# Patient Record
Sex: Female | Born: 1980 | Race: Black or African American | Hispanic: No | Marital: Single | Smoking: Never smoker
Health system: Southern US, Community
[De-identification: ages and names within clinical notes are randomized; demographics above are authoritative.]

## PROBLEM LIST (undated history)

## (undated) DIAGNOSIS — Z789 Other specified health status: Secondary | ICD-10-CM

## (undated) HISTORY — PX: NO PAST SURGERIES: SHX2092

## (undated) HISTORY — PX: BREAST ENHANCEMENT SURGERY: SHX7

---

## 2002-11-29 ENCOUNTER — Other Ambulatory Visit: Admission: RE | Admit: 2002-11-29 | Discharge: 2002-11-29 | Payer: Self-pay | Admitting: Obstetrics and Gynecology

## 2002-12-11 ENCOUNTER — Encounter: Payer: Self-pay | Admitting: Obstetrics and Gynecology

## 2002-12-11 ENCOUNTER — Ambulatory Visit (HOSPITAL_COMMUNITY): Admission: RE | Admit: 2002-12-11 | Discharge: 2002-12-11 | Payer: Self-pay | Admitting: Obstetrics and Gynecology

## 2003-05-16 ENCOUNTER — Inpatient Hospital Stay (HOSPITAL_COMMUNITY): Admission: AD | Admit: 2003-05-16 | Discharge: 2003-05-17 | Payer: Self-pay | Admitting: Obstetrics and Gynecology

## 2003-05-19 ENCOUNTER — Encounter (INDEPENDENT_AMBULATORY_CARE_PROVIDER_SITE_OTHER): Payer: Self-pay | Admitting: Specialist

## 2003-05-19 ENCOUNTER — Inpatient Hospital Stay (HOSPITAL_COMMUNITY): Admission: AD | Admit: 2003-05-19 | Discharge: 2003-05-23 | Payer: Self-pay | Admitting: Obstetrics and Gynecology

## 2003-07-12 ENCOUNTER — Emergency Department (HOSPITAL_COMMUNITY): Admission: EM | Admit: 2003-07-12 | Discharge: 2003-07-12 | Payer: Self-pay | Admitting: Emergency Medicine

## 2006-11-09 ENCOUNTER — Inpatient Hospital Stay (HOSPITAL_COMMUNITY): Admission: AD | Admit: 2006-11-09 | Discharge: 2006-11-11 | Payer: Self-pay | Admitting: Obstetrics and Gynecology

## 2007-08-30 ENCOUNTER — Encounter: Admission: RE | Admit: 2007-08-30 | Discharge: 2007-08-30 | Payer: Self-pay | Admitting: Internal Medicine

## 2007-09-14 ENCOUNTER — Emergency Department (HOSPITAL_COMMUNITY): Admission: EM | Admit: 2007-09-14 | Discharge: 2007-09-14 | Payer: Self-pay | Admitting: Emergency Medicine

## 2010-09-02 NOTE — Discharge Summary (Signed)
Maria Hoffman, Maria Hoffman           ACCOUNT NO.:  [id number]   MEDICAL RECORD NO.:  [id number]          PATIENT TYPE:  INP   LOCATION:  9136                          FACILITY:  [facility]   PHYSICIAN:  Sherron Monday, MD        DATE OF BIRTH:  25-Nov-1980   DATE OF ADMISSION:  11/09/2006  DATE OF DISCHARGE:  11/11/2006                               DISCHARGE SUMMARY   ADMITTING DIAGNOSIS:  Intrauterine pregnancy at term with favorable  cervix for induction.   DISCHARGE DIAGNOSIS:  Intrauterine pregnancy at term with favorable  cervix for induction, delivered by spontaneous vaginal delivery.   HISTORY OF PRESENT ILLNESS:  A 30 year old African American female G2,  P1-0-0-1 at 40+ weeks by 16-week ultrasound with an [facility] of November 03, 2006, presents for induction of unfavorable cervix.  Her prenatal care  was essentially uncomplicated.  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  See prenatal forms for complete  history.   PAST MEDICAL HISTORY:  Significant for history of postpartum thyroid  dysfunction.   PAST SURGICAL HISTORY:  Not significant.   PAST OB/GYN HISTORY:  G1 was in 2005, spontaneous vaginal delivery 7  pounds 6 ounces, no complications.  She has a history of herpes  infection as well.   MEDICATIONS:  Valtrex daily.   ALLERGIES:  No known drug allergies.   SOCIAL HISTORY:  Patient denies alcohol, tobacco or drug use.   PRENATAL LABS:  She is O positive, antibody screen negative, RPR  nonreactive, rubella immune, hepatitis C surface antigen negative,  HIV  negative, gonorrhea and Chlamydia negative.  Quad screen within normal  limits, one-hour Glucola of 78, group B strep was negative.  On  admission, she was afebrile.  Vital signs stable.  Fetal heart tones  reactive with contractions every 3-4 minutes.  Abdomen was gravida,  nontender with an estimated fetal weight of approximately 8 pounds.  Her  vaginal rim was 316 -1.  Her vertex  was an adequate pelvis.  At the time  of her admission, she is AROM for clear fluid.  Her examination was  otherwise benign.  She was admitted, started on Pitocin and her progress  was monitored.  Her intrapartum course was relatively uncomplicated.  She progressed to complete +2, pushed well to deliver a viable female  infant at 2128 hours and with a weight of 6 pounds 15 ounces.  Placenta  was delivered intact and sent the cord, blood collection.  Her perineum  was intact.  Her postpartum course was relatively uncomplicated.  She  maintained stable vital signs throughout and benign examination.  She  desired to go home on postpartum day 2.  This was discussed with the  patient including routine discharge instructions.  If any worsening to  call if any questions or problems.  She voices her understanding to all  this and wishes to be discharged.  Her hemoglobin decreased from 11.5 to 9.9.  Other discharge information:  She is O positive, rubella immune.  She plans to breastfeed and would  like to get an IUD postpartum.  We will chest her benefits and mail her  a packet regarding this.      Sherron Monday, MD  Electronically Signed     JB/MEDQ  D:  11/11/2006  T:  11/11/2006  Job:  [id number]

## 2010-09-05 NOTE — Discharge Summary (Signed)
NAME:  Maria Hoffman, Maria Hoffman                     ACCOUNT NO.:  [id number]   MEDICAL RECORD NO.:  [id number]                   PATIENT TYPE:  INP   LOCATION:  9122                                 FACILITY:  [facility]   PHYSICIAN:  Malachi Pro. Ambrose Mantle, M.D.              DATE OF BIRTH:  1981/02/15   DATE OF ADMISSION:  05/19/2003  DATE OF DISCHARGE:  05/23/2003                                 DISCHARGE SUMMARY   A 30 year old single black female para 0, gravida 1, EDC May 14, 2003,  by ultrasound admitted with contractions, low grade temperature elevation  and possible prolonged rupture of membranes.  Blood group and type O  positive, negative antibody, sickle cell negative.  RPR nonreactive.  Rubella immune.  Hepatitis B surface antigen negative.  HIV negative.  GC  and chlamydia negative.  Triple screen normal.  Group B strep positive.  One  hour Glucola 98.  Vaginal ultrasound October 10, 2002, crown rump length 2.48  cm, 9 weeks one day, [facility] May 14, 2003.  Repeat ultrasound on December 11, 2002, mean gestational age [redacted] weeks one day, [facility] May 13, 2003.  Echogenic intracardiac focus was noted in the left ventricle.   Prenatal care was uncomplicated.  The patient was placed on Valtrex 500 mg  by mouth everyday on April 11, 2003, because of a history of Herpes.  Cervix was fingertip dilated, 50% effaced, vertex at a -3 on May 14, 2003, and she was scheduled for induction on May 24, 2003.  The patient  had possible premature rupture of membranes on May 17, 2003, and was  seen in the maternity admission unit.  Crist Fat was negative.  The patient  states she began to have symptoms of a URI and felt feverish.  Now she has  congestion in her chest and she has continued to leak fluid.  She came to  the maternity admission unit on the day of admission with a temperature of  100.0 degrees, contractions every two to four minutes.  She was admitted  after labor did not ensue and she was  placed on Pitocin.   PAST MEDICAL HISTORY:  No known allergies, no operations, no illnesses.   ALCOHOL, TOBACCO AND DRUGS:  None.   FAMILY HISTORY:  Mother with thyroid problem.  No close family history of  any major illnesses.   PHYSICAL EXAMINATION:  VITAL SIGNS:  Temperature was 100 degrees then became  99.9, pulse 130, respiratory rate 20, blood pressure 121/87.  LUNGS:  Clear.  CARDIOVASCULAR:  There was a normal sinus tachycardia with no murmurs.  ABDOMEN:  Soft and nontender.  Fetal heart tones were normal.  PELVIC:  The cervix was loose, fingertip, 80% effaced, vertex at a -2.   Artificial rupture of membranes was attempted but there was no fluid.  I was  not certain any membranes were present.   IMPRESSION:  1. Intrauterine pregnancy at 40+ weeks.  2.  Upper respiratory tract infection.  3. Possible prolonged rupture of membranes.   The patient was initially placed on penicillin but I elected to broaden her  coverage with Unasyn because of the possible prolonged rupture of membranes  and the fact that I did not feel any membranes when I examined her.  She  received an epidural after she progressed to 4 cm on Pitocin, after which  she progressed rapidly to full dilatation and delivered spontaneously OA  over a first degree vaginal left labial laceration by Dr. Ambrose Mantle, a living  female infant 7 pounds 6 ounces, Apgars of 9 at one and 9 at five minutes.  Placenta was intact. Uterus normal.  Rectal negative.  Vaginal and labial  lacerations were repaired with 3-0 Vicryl suture for hemostasis.  Temperature after delivery was 103 degrees.  After birth the patient was  retracting with nasal flarings and was taken to the nursery and blood loss  was about 400 mL.  The patient continued to be febrile, so antibiotic  coverage was increased to clindamycin and gentamicin.  On this regimen, she  gradually defervesced.  I was never sure whether this elevated temperature  came from  chorioamnionitis or the upper respiratory tract infection or  whether it was a combination of the two but on the fourth postpartum day,  she was afebrile for 36 hours, doing well, and was ready for discharge.  Urine culture showed no growth.  Blood cultures to date are negative.  Initial hemoglobin 13.0, hematocrit 37.3, white count 13,400.  Follow-up  hematocrits were essentially stable.  White count never went above 14,500.  Comprehensive metabolic profile was normal except for the abnormalities  usually associated with pregnancy.   FINAL DIAGNOSES:  1. Intrauterine pregnancy at 40+ weeks, delivered vertex.  2. Possible chorioamnionitis.  3. Sinusitis.  4. Upper respiratory tract infection.   FINAL CONDITION:  Improved.   DISCHARGE INSTRUCTIONS:  Regular discharge instruction booklet.  Augmentin  500 mg p.o. q.8h. for three days.  Return to the office in six weeks for  follow-up examination.                                               Malachi Pro. Ambrose Mantle, M.D.    TFH/MEDQ  D:  05/23/2003  T:  05/23/2003  Job:  [id number]

## 2010-10-07 ENCOUNTER — Other Ambulatory Visit: Payer: Self-pay | Admitting: Otolaryngology

## 2010-10-07 DIAGNOSIS — E041 Nontoxic single thyroid nodule: Secondary | ICD-10-CM

## 2011-01-14 LAB — POCT URINALYSIS DIP (DEVICE)
Bilirubin Urine: NEGATIVE
Glucose, UA: NEGATIVE
Hgb urine dipstick: NEGATIVE
Ketones, ur: NEGATIVE
Nitrite: NEGATIVE
Operator id: 239701
Protein, ur: NEGATIVE
Specific Gravity, Urine: 1.02
Urobilinogen, UA: 0.2
pH: 5.5

## 2011-01-14 LAB — GC/CHLAMYDIA PROBE AMP, GENITAL
Chlamydia, DNA Probe: NEGATIVE
GC Probe Amp, Genital: NEGATIVE

## 2011-01-14 LAB — POCT PREGNANCY, URINE
Operator id: 239701
Preg Test, Ur: NEGATIVE

## 2011-02-02 LAB — CBC
HCT: 29.1 — ABNORMAL LOW
HCT: 34.2 — ABNORMAL LOW
Hemoglobin: 11.5 — ABNORMAL LOW
MCHC: 33.7
MCHC: 34
MCV: 94.3
Platelets: 216
RBC: 3.09 — ABNORMAL LOW
RBC: 3.61 — ABNORMAL LOW
RDW: 13.3

## 2011-03-11 ENCOUNTER — Other Ambulatory Visit: Payer: Self-pay | Admitting: Endocrinology

## 2011-03-11 DIAGNOSIS — E041 Nontoxic single thyroid nodule: Secondary | ICD-10-CM

## 2011-03-24 ENCOUNTER — Ambulatory Visit
Admission: RE | Admit: 2011-03-24 | Discharge: 2011-03-24 | Disposition: A | Discharge status: Home / Self Care | Payer: No Typology Code available for payment source | Source: Ambulatory Visit | Attending: Endocrinology | Admitting: Endocrinology

## 2011-03-24 DIAGNOSIS — E041 Nontoxic single thyroid nodule: Secondary | ICD-10-CM

## 2011-03-27 ENCOUNTER — Other Ambulatory Visit: Payer: Self-pay | Admitting: Endocrinology

## 2011-03-27 DIAGNOSIS — E041 Nontoxic single thyroid nodule: Secondary | ICD-10-CM

## 2011-04-01 ENCOUNTER — Ambulatory Visit
Admission: RE | Admit: 2011-04-01 | Discharge: 2011-04-01 | Disposition: A | Discharge status: Home / Self Care | Payer: Self-pay | Source: Ambulatory Visit | Attending: Endocrinology | Admitting: Endocrinology

## 2011-04-01 DIAGNOSIS — E041 Nontoxic single thyroid nodule: Secondary | ICD-10-CM

## 2011-04-30 ENCOUNTER — Other Ambulatory Visit: Payer: Self-pay | Admitting: Endocrinology

## 2011-04-30 DIAGNOSIS — E041 Nontoxic single thyroid nodule: Secondary | ICD-10-CM

## 2011-06-09 ENCOUNTER — Ambulatory Visit
Admission: RE | Admit: 2011-06-09 | Discharge: 2011-06-09 | Disposition: A | Discharge status: Home / Self Care | Payer: Self-pay | Source: Ambulatory Visit | Attending: Endocrinology | Admitting: Endocrinology

## 2011-06-09 ENCOUNTER — Other Ambulatory Visit (HOSPITAL_COMMUNITY)
Admission: RE | Admit: 2011-06-09 | Discharge: 2011-06-09 | Disposition: A | Discharge status: Home / Self Care | Payer: Self-pay | Source: Ambulatory Visit | Attending: Interventional Radiology | Admitting: Interventional Radiology

## 2011-06-09 DIAGNOSIS — E041 Nontoxic single thyroid nodule: Secondary | ICD-10-CM

## 2011-06-09 DIAGNOSIS — E049 Nontoxic goiter, unspecified: Secondary | ICD-10-CM | POA: Insufficient documentation

## 2011-09-15 ENCOUNTER — Ambulatory Visit: Payer: Self-pay | Admitting: Internal Medicine

## 2011-09-15 VITALS — BP 119/80 | HR 67 | Temp 98.3°F | Ht 66.0 in | Wt 162.0 lb

## 2011-09-15 DIAGNOSIS — N920 Excessive and frequent menstruation with regular cycle: Secondary | ICD-10-CM

## 2011-09-15 DIAGNOSIS — Z202 Contact with and (suspected) exposure to infections with a predominantly sexual mode of transmission: Secondary | ICD-10-CM

## 2011-09-15 LAB — POCT UA - MICROSCOPIC ONLY
Casts, Ur, LPF, POC: NEGATIVE
Crystals, Ur, HPF, POC: NEGATIVE

## 2011-09-15 LAB — POCT URINALYSIS DIPSTICK
Protein, UA: NEGATIVE
Spec Grav, UA: >=1.03
Urobilinogen, UA: 0.2
pH, UA: 5.5

## 2011-09-15 MED ORDER — METRONIDAZOLE 500 MG PO TABS: 500.0000 mg | ORAL_TABLET | Freq: Two times a day (BID) | ORAL | Status: AC

## 2011-09-15 NOTE — Progress Notes (Signed)
  Subjective:    Patient ID: Maria Hoffman, female    DOB: 12-29-1980, 31 y.o.   MRN: [id number]  HPI Exposed 1 week ago Has no sxs Only wants to screen for STDs she requests, not all.   Review of Systems Neg     Objective:   Physical Exam  Normal, no rashes  Results for orders placed in visit on 09/15/11  POCT UA - MICROSCOPIC ONLY      Component Value Range   WBC, Ur, HPF, POC 0-2     RBC, urine, microscopic 1-3     Bacteria, U Microscopic trace     Mucus, UA negative     Epithelial cells, urine per micros 0-1     Crystals, Ur, HPF, POC negative     Casts, Ur, LPF, POC negative     Yeast, UA negative    POCT URINALYSIS DIPSTICK      Component Value Range   Color, UA yellow     Clarity, UA cloudy     Glucose, UA negative     Bilirubin, UA negative     Ketones, UA trace     Spec Grav, UA >=1.030     Blood, UA large     pH, UA 5.5     Protein, UA negative     Urobilinogen, UA 0.2     Nitrite, UA negative     Leukocytes, UA Negative    Normal exam, on menses [facility] reading (PRIMARY) by  Dr.Catheleen Langhorne ok       Assessment & Plan:  Possible STD exposure Flagyl 500mg  bid

## 2011-09-16 LAB — GC/CHLAMYDIA PROBE AMP, URINE: Chlamydia, Swab/Urine, PCR: NEGATIVE

## 2011-09-17 ENCOUNTER — Encounter: Payer: Self-pay | Admitting: *Deleted

## 2012-04-30 ENCOUNTER — Emergency Department (HOSPITAL_COMMUNITY): Payer: No Typology Code available for payment source

## 2012-04-30 ENCOUNTER — Emergency Department (HOSPITAL_COMMUNITY)
Admission: EM | Admit: 2012-04-30 | Discharge: 2012-04-30 | Disposition: A | Discharge status: Home / Self Care | Payer: No Typology Code available for payment source | Attending: Emergency Medicine | Admitting: Emergency Medicine

## 2012-04-30 DIAGNOSIS — S0993XA Unspecified injury of face, initial encounter: Secondary | ICD-10-CM | POA: Insufficient documentation

## 2012-04-30 DIAGNOSIS — S0003XA Contusion of scalp, initial encounter: Secondary | ICD-10-CM | POA: Insufficient documentation

## 2012-04-30 DIAGNOSIS — Y92410 Unspecified street and highway as the place of occurrence of the external cause: Secondary | ICD-10-CM | POA: Insufficient documentation

## 2012-04-30 DIAGNOSIS — S298XXA Other specified injuries of thorax, initial encounter: Secondary | ICD-10-CM | POA: Insufficient documentation

## 2012-04-30 DIAGNOSIS — Y9389 Activity, other specified: Secondary | ICD-10-CM | POA: Insufficient documentation

## 2012-04-30 MED ORDER — HYDROCODONE-ACETAMINOPHEN 5-325 MG PO TABS: 1.0000 | ORAL_TABLET | ORAL | Status: DC | PRN

## 2012-04-30 MED ORDER — OXYCODONE-ACETAMINOPHEN 5-325 MG PO TABS
1.0000 | ORAL_TABLET | Freq: Once | ORAL | Status: AC
  Administered 2012-04-30: 1 via ORAL
  Filled 2012-04-30: qty 1

## 2012-04-30 MED ORDER — CYCLOBENZAPRINE HCL 10 MG PO TABS: 5.0000 mg | ORAL_TABLET | Freq: Two times a day (BID) | ORAL | Status: DC | PRN

## 2012-04-30 MED ORDER — IBUPROFEN 600 MG PO TABS: 600.0000 mg | ORAL_TABLET | Freq: Four times a day (QID) | ORAL | Status: DC | PRN

## 2012-04-30 MED ORDER — IBUPROFEN 800 MG PO TABS: 800.0000 mg | ORAL_TABLET | Freq: Once | ORAL | Status: DC
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
## 2012-05-01 NOTE — ED Provider Notes (Signed)
Medical screening examination/treatment/procedure(s) were performed by non-physician practitioner and as supervising physician I was immediately available for consultation/collaboration.   Keslie Gritz E Ezra Marquess, MD 05/01/12 1527 

## 2012-08-13 ENCOUNTER — Ambulatory Visit (INDEPENDENT_AMBULATORY_CARE_PROVIDER_SITE_OTHER): Payer: No Typology Code available for payment source | Admitting: Family Medicine

## 2012-08-13 VITALS — BP 120/90 | HR 80 | Temp 97.9°F | Resp 16 | Ht 66.5 in | Wt 172.4 lb

## 2012-08-13 DIAGNOSIS — L237 Allergic contact dermatitis due to plants, except food: Secondary | ICD-10-CM

## 2012-08-13 DIAGNOSIS — L255 Unspecified contact dermatitis due to plants, except food: Secondary | ICD-10-CM

## 2012-08-13 MED ORDER — PREDNISONE 10 MG PO TABS: ORAL_TABLET | ORAL | Status: DC

## 2012-08-13 MED ORDER — METHYLPREDNISOLONE ACETATE 80 MG/ML IJ SUSP
120.0000 mg | Freq: Once | INTRAMUSCULAR | Status: AC
  Administered 2012-08-13: 120 mg via INTRAMUSCULAR

## 2012-08-13 MED ORDER — TRIAMCINOLONE ACETONIDE 0.1 % EX CREA: TOPICAL_CREAM | Freq: Two times a day (BID) | CUTANEOUS | Status: DC

## 2012-08-13 NOTE — Progress Notes (Signed)
Subjective:    Patient ID: Maria Hoffman, female    DOB: 1980/09/28, 32 y.o.   MRN: [id number] Chief Complaint  Patient presents with  . Poison Ivy    x 1 week   HPI  Maria Hoffman thought she had a mosquito bite on the back of her leg and kept itching it. After several d, she finally looked at it and noticed it was poison ivy. Since then she has tried not to itch it but it has spread in the shower and when she is applying calamine cream.  She has a large weeping area on left forearm as well as small patches over both arms from hands to axilla, a few spots on her abdomen, neck, and thinks she is developing some on her face.  She has a h/o freq BV infections but thinks she is allergic to the metronidazole - "the sulfa comes out of my body by burning a black spot."  She had not finished a course she was prescribed before and just developed a new vaginal discharge so took some flagyl again - now wondering if these is something she can use to lighten the dark spots on her lips.  She currently does not have a vag d/c as the flagyl worked.  History reviewed. No pertinent past medical history. Current Outpatient Prescriptions on File Prior to Visit  Medication Sig Dispense Refill  . cyclobenzaprine (FLEXERIL) 10 MG tablet Take 0.5 tablets (5 mg total) by mouth 2 (two) times daily as needed for muscle spasms.  12 tablet  0  . HYDROcodone-acetaminophen (NORCO/VICODIN) 5-325 MG per tablet Take 1 tablet by mouth every 4 (four) hours as needed for pain.  10 tablet  0  . ibuprofen (ADVIL,MOTRIN) 600 MG tablet Take 1 tablet (600 mg total) by mouth every 6 (six) hours as needed for pain.  30 tablet  0   No current facility-administered medications on file prior to visit.   Allergies  Allergen Reactions  . Sulfa Antibiotics     Makes dark marks on skin    Review of Systems  Constitutional: Negative for fever, chills and diaphoresis.  Genitourinary: Negative for vaginal discharge.    Musculoskeletal: Negative for joint swelling and arthralgias.  Skin: Positive for color change and rash. Negative for pallor and wound.  Hematological: Negative for adenopathy. Does not bruise/bleed easily.  Psychiatric/Behavioral: Positive for sleep disturbance.      BP 120/90  Pulse 80  Temp(Src) 97.9 F (36.6 C) (Oral)  Resp 16  Ht 5' 6.5" (1.689 m)  Wt 172 lb 6.4 oz (78.2 kg)  BMI 27.41 kg/m2  SpO2 100%  LMP 07/31/2012 Objective:   Physical Exam  Constitutional: She is oriented to person, place, and time. She appears well-developed and well-nourished. No distress.  HENT:  Head: Normocephalic and atraumatic.  Right Ear: External ear normal.  Eyes: Conjunctivae are normal. No scleral icterus.  Pulmonary/Chest: Effort normal.  Neurological: She is alert and oriented to person, place, and time.  Skin: Skin is warm and dry. Rash noted. Rash is maculopapular and vesicular. She is not diaphoretic. No erythema.     Has large weeping erythematous vesicular rash with clear yellow fluid over all extremities.  Papules over face and hands.  Psychiatric: She has a normal mood and affect. Her behavior is normal.      Assessment & Plan:  Poison ivy dermatitis - Plan: methylPREDNISolone acetate (DEPO-MEDROL) injection 120 mg, triamcinolone cream (KENALOG) 0.1 %, predniSONE (DELTASONE) 10 MG tablet - IM  depomedrol given in office. In 3d (on Tues) start prednisone taper.  Ok to use topical triamcinolone prn but not on face  Meds ordered this encounter  Medications  . methylPREDNISolone acetate (DEPO-MEDROL) injection 120 mg    Sig:   . triamcinolone cream (KENALOG) 0.1 %    Sig: Apply topically 2 (two) times daily.    Dispense:  45 g    Refill:  0  . predniSONE (DELTASONE) 10 MG tablet    Sig: Take 6 tabs po d1, 5 tabs po d2, 4 tabs po d3, 3 tabs po d4, 2 tabs po d5, 1 tab po d6    Dispense:  21 tablet    Refill:  0   Flagyl allergy - advised to RTC for eval with next occurrence of  vag discharge and not to take old antibiotics.  Can use rx alternative to BV if flagyl allergy but will need lab diagnosis before prescribing.

## 2012-08-13 NOTE — Patient Instructions (Addendum)
Poison Ivy  Poison ivy is a inflammation of the skin (contact dermatitis) caused by touching the allergens on the leaves of the ivy plant following previous exposure to the plant. The rash usually appears 48 hours after exposure. The rash is usually bumps (papules) or blisters (vesicles) in a linear pattern. Depending on your own sensitivity, the rash may simply cause redness and itching, or it may also progress to blisters which may break open. These must be well cared for to prevent secondary bacterial (germ) infection, followed by scarring. Keep any open areas dry, clean, dressed, and covered with an antibacterial ointment if needed. The eyes may also get puffy. The puffiness is worst in the morning and gets better as the day progresses. This dermatitis usually heals without scarring, within 2 to 3 weeks without treatment.  HOME CARE INSTRUCTIONS   Thoroughly wash with soap and water as soon as you have been exposed to poison ivy. You have about one half hour to remove the plant resin before it will cause the rash. This washing will destroy the oil or antigen on the skin that is causing, or will cause, the rash. Be sure to wash under your fingernails as any plant resin there will continue to spread the rash. Do not rub skin vigorously when washing affected area. Poison ivy cannot spread if no oil from the plant remains on your body. A rash that has progressed to weeping sores will not spread the rash unless you have not washed thoroughly. It is also important to wash any clothes you have been wearing as these may carry active allergens. The rash will return if you wear the unwashed clothing, even several days later.  Avoidance of the plant in the future is the best measure. Poison ivy plant can be recognized by the number of leaves. Generally, poison ivy has three leaves with flowering branches on a single stem.  Diphenhydramine may be purchased over the counter and used as needed for itching. Do not drive with  this medication if it makes you drowsy.Ask your caregiver about medication for children.  SEEK MEDICAL CARE IF:   Open sores develop.   Redness spreads beyond area of rash.   You notice purulent (pus-like) discharge.   You have increased pain.   Other signs of infection develop (such as fever).  Document Released: 04/03/2000 Document Revised: 06/29/2011 Document Reviewed: 02/20/2009  [employer] Patient Information 2013 [employer].

## 2012-08-15 ENCOUNTER — Other Ambulatory Visit: Payer: Self-pay | Admitting: Family Medicine

## 2012-08-15 NOTE — Telephone Encounter (Signed)
Do you want to RF this? 

## 2012-08-16 ENCOUNTER — Telehealth: Payer: Self-pay

## 2012-08-16 NOTE — Telephone Encounter (Signed)
Pt is calling because she states that she called twice yesterday and someone hung up on her but she is needing to have a cream refilled for her poison oak Call back number is [phone]

## 2012-08-16 NOTE — Telephone Encounter (Signed)
I do not see documentation of a call, her kenalog cream was sent in yesterday. Called her to advise. pts voice mail not set up.

## 2012-08-17 MED ORDER — TRIAMCINOLONE ACETONIDE 0.1 % EX CREA: TOPICAL_CREAM | CUTANEOUS | Status: DC

## 2012-08-17 NOTE — Telephone Encounter (Signed)
Called her to advise this was sent in for her, she states pharmacy did not have this, it is resubmitted.

## 2013-05-25 ENCOUNTER — Ambulatory Visit (INDEPENDENT_AMBULATORY_CARE_PROVIDER_SITE_OTHER): Payer: BC Managed Care – PPO | Admitting: Emergency Medicine

## 2013-05-25 VITALS — BP 120/70 | HR 97 | Temp 98.6°F | Resp 16 | Ht 66.2 in | Wt 174.8 lb

## 2013-05-25 DIAGNOSIS — N898 Other specified noninflammatory disorders of vagina: Secondary | ICD-10-CM

## 2013-05-25 DIAGNOSIS — Z331 Pregnant state, incidental: Secondary | ICD-10-CM

## 2013-05-25 DIAGNOSIS — Z3490 Encounter for supervision of normal pregnancy, unspecified, unspecified trimester: Secondary | ICD-10-CM

## 2013-05-25 LAB — POCT WET PREP WITH KOH
KOH Prep POC: NEGATIVE
TRICHOMONAS UA: POSITIVE
Yeast Wet Prep HPF POC: NEGATIVE

## 2013-05-25 LAB — POCT URINE PREGNANCY: Preg Test, Ur: POSITIVE

## 2013-05-25 NOTE — Progress Notes (Addendum)
Subjective:   This chart was scribed for Maria SpareSteven A. Cleta Albertsaub, MD, by Maria Hoffman, ED Scribe.   Patient ID: Maria MussRachel A Hoffman, female    DOB: [birth year], 33 y.o.   MRN: [id number]  Vaginal Discharge The patient's primary symptoms include a vaginal discharge. Pertinent negatives include no abdominal pain, chills, fever or headaches.    Maria Hoffman is a 33 y.o. female, with a h/o GU bacterial infections, who [facility] complaining of vaginal discharge which began several months ago.  She states that with the presentation of symptoms, she has noticed she bleeds more easily with intercourse. She denies a recent change in sexual partners. The pt uses condoms prophalactically.  Maria Hoffman's last pap smear was approximately a year ago.   She denies allergies to latex.  History reviewed. No pertinent past medical history.  Current Outpatient Prescriptions on File Prior to Visit  Medication Sig Dispense Refill  . cyclobenzaprine (FLEXERIL) 10 MG tablet Take 0.5 tablets (5 mg total) by mouth 2 (two) times daily as needed for muscle spasms.  12 tablet  0  . HYDROcodone-acetaminophen (NORCO/VICODIN) 5-325 MG per tablet Take 1 tablet by mouth every 4 (four) hours as needed for pain.  10 tablet  0  . ibuprofen (ADVIL,MOTRIN) 600 MG tablet Take 1 tablet (600 mg total) by mouth every 6 (six) hours as needed for pain.  30 tablet  0  . predniSONE (DELTASONE) 10 MG tablet Take 6 tabs po d1, 5 tabs po d2, 4 tabs po d3, 3 tabs po d4, 2 tabs po d5, 1 tab po d6  21 tablet  0  . triamcinolone cream (KENALOG) 0.1 % APPLY TOPICALLY 2 (TWO) TIMES DAILY.  30 g  0   No current facility-administered medications on file prior to visit.    Review of Systems  Constitutional: Negative for fever, chills, fatigue and unexpected weight change.  Respiratory: Negative for chest tightness and shortness of breath.   Cardiovascular: Negative for chest pain, palpitations and leg swelling.  Gastrointestinal:  Negative for abdominal pain and blood in stool.  Genitourinary: Positive for vaginal bleeding (With intercourse), vaginal discharge and dyspareunia.  Neurological: Negative for dizziness, syncope, light-headedness and headaches.    Vitals: BP 120/70  Pulse 97  Temp(Src) 98.6 F (37 C) (Oral)  Resp 16  Ht 5' 6.2" (1.681 m)  Wt 174 lb 12.8 oz (79.289 kg)  BMI 28.06 kg/m2  SpO2 100%  LMP 05/12/2013     Objective:   Physical Exam  CONSTITUTIONAL: Well developed/well nourished HEAD: Normocephalic/atraumatic EYES: EOMI/PERRL ENMT: Mucous membranes moist NECK: supple no meningeal signs SPINE:entire spine nontender CV: S1/S2 noted, no murmurs/rubs/gallops noted LUNGS: Lungs are clear to auscultation bilaterally, no apparent distress ABDOMEN: soft, nontender, no rebound or guarding there's a whitish yellow discharge in the vault. There are no adnexal masses. Uterus is not enlarged GU:no cva tenderness NEURO: Pt is awake/alert, moves all extremitiesx4 EXTREMITIES: pulses normal, full ROM SKIN: warm, color normal PSYCH: no abnormalities of mood noted Results for orders placed in visit on 05/25/13  POCT URINE PREGNANCY      Result Value Range   Preg Test, Ur Positive    POCT WET PREP WITH KOH      Result Value Range   Trichomonas, UA Positive     Clue Cells Wet Prep HPF POC 0-2     Epithelial Wet Prep HPF POC 3-10     Yeast Wet Prep HPF POC negative     Bacteria Wet  Prep HPF POC 1+     RBC Wet Prep HPF POC 2-8     WBC Wet Prep HPF POC 2-6     KOH Prep POC Negative         Assessment & Plan:  Patient has a positive pregnancy test and also tested positive for trichomoniasis. She is adamant that she is not pregnant. We'll do a quant. HCG as well as a pelvic ultrasound to clarify the issue. I held off on treatment of her discharge until this is resolved.

## 2013-05-26 LAB — HCG, QUANTITATIVE, PREGNANCY: hCG, Beta Chain, Quant, S: 4.9 m[IU]/mL

## 2013-05-29 ENCOUNTER — Ambulatory Visit (HOSPITAL_COMMUNITY)
Admission: RE | Admit: 2013-05-29 | Discharge: 2013-05-29 | Disposition: A | Discharge status: Home / Self Care | Payer: BC Managed Care – PPO | Source: Ambulatory Visit | Attending: Emergency Medicine | Admitting: Emergency Medicine

## 2013-05-29 ENCOUNTER — Other Ambulatory Visit: Payer: Self-pay | Admitting: Emergency Medicine

## 2013-05-29 DIAGNOSIS — Z3490 Encounter for supervision of normal pregnancy, unspecified, unspecified trimester: Secondary | ICD-10-CM

## 2013-05-29 DIAGNOSIS — O209 Hemorrhage in early pregnancy, unspecified: Secondary | ICD-10-CM | POA: Insufficient documentation

## 2013-05-29 LAB — PAP IG, CT-NG, RFX HPV ASCU
Chlamydia Probe Amp: NEGATIVE
GC Probe Amp: NEGATIVE

## 2013-05-30 ENCOUNTER — Other Ambulatory Visit: Payer: Self-pay | Admitting: Emergency Medicine

## 2013-05-30 ENCOUNTER — Other Ambulatory Visit: Payer: Self-pay | Admitting: Radiology

## 2013-05-30 ENCOUNTER — Other Ambulatory Visit: Payer: Self-pay | Admitting: Family Medicine

## 2013-05-30 ENCOUNTER — Telehealth: Payer: Self-pay

## 2013-05-30 ENCOUNTER — Other Ambulatory Visit: Payer: Self-pay

## 2013-05-30 ENCOUNTER — Telehealth: Payer: Self-pay | Admitting: Radiology

## 2013-05-30 DIAGNOSIS — A599 Trichomoniasis, unspecified: Secondary | ICD-10-CM

## 2013-05-30 DIAGNOSIS — Z3490 Encounter for supervision of normal pregnancy, unspecified, unspecified trimester: Secondary | ICD-10-CM

## 2013-05-30 DIAGNOSIS — Z331 Pregnant state, incidental: Secondary | ICD-10-CM

## 2013-05-30 MED ORDER — METRONIDAZOLE 500 MG PO TABS: 500.0000 mg | ORAL_TABLET | Freq: Two times a day (BID) | ORAL | Status: DC

## 2013-05-30 NOTE — Telephone Encounter (Signed)
Patient has refused follow up ultrasound, adamant she is not pregnant. To you FYI

## 2013-05-30 NOTE — Telephone Encounter (Signed)
If she wants to hold off on the ultrasound I am okay with that except it is very important she have a repeat blood test done for an hCG level

## 2013-05-30 NOTE — Telephone Encounter (Signed)
Lily asked me to put US OB transvaginal and US OB com less into computer as orders. They are in as referrals.

## 2013-05-30 NOTE — Telephone Encounter (Signed)
UTD states OK to use Flagyl in first trimester. Rx called in.

## 2013-05-30 NOTE — Telephone Encounter (Signed)
PATIENT STATES WE CALLED IN A  MEDICATION FOR HER BACTERIAL INFECTION TODAY. WHEN SHE WENT TO THE PHARMACY TO PICK IT UP, SHE REALIZED THAT SHE IS ALLERGIC TO IT. SHE WOULD LIKE TO HAVE SOMETHING ELSE CALLED INTO HER PHARMACY PLEASE. BEST PHONE [phone] (CELL)   PHARMACY CHOICE IS [facility] ON [address].  MBC

## 2013-05-31 ENCOUNTER — Other Ambulatory Visit: Payer: Self-pay | Admitting: Emergency Medicine

## 2013-05-31 DIAGNOSIS — B9689 Other specified bacterial agents as the cause of diseases classified elsewhere: Secondary | ICD-10-CM

## 2013-05-31 DIAGNOSIS — N760 Acute vaginitis: Principal | ICD-10-CM

## 2013-05-31 MED ORDER — CLINDAMYCIN PHOSPHATE 2 % VA CREA: 1.0000 | TOPICAL_CREAM | Freq: Every day | VAGINAL | Status: DC

## 2013-05-31 NOTE — Addendum Note (Signed)
Addended by: Johnnette LitterARDWELL, Sondra Blixt M on: 05/31/2013 06:04 PM   Modules accepted: Orders

## 2013-05-31 NOTE — Telephone Encounter (Signed)
Patient allergic to flagyl changed to cleocin vag.Please put in record allergic to Flagyl.

## 2013-06-02 NOTE — Telephone Encounter (Signed)
Patient has been advised of need for US and for the repeat labs.

## 2013-06-12 ENCOUNTER — Ambulatory Visit (HOSPITAL_COMMUNITY): Admission: RE | Admit: 2013-06-12 | Payer: BC Managed Care – PPO | Source: Ambulatory Visit

## 2013-11-22 ENCOUNTER — Encounter (HOSPITAL_COMMUNITY): Payer: Self-pay | Admitting: *Deleted

## 2013-11-22 ENCOUNTER — Inpatient Hospital Stay (HOSPITAL_COMMUNITY)
Admission: AD | Admit: 2013-11-22 | Discharge: 2013-11-22 | Disposition: A | Discharge status: Home / Self Care | Payer: BC Managed Care – PPO | Source: Ambulatory Visit | Attending: Obstetrics & Gynecology | Admitting: Obstetrics & Gynecology

## 2013-11-22 ENCOUNTER — Inpatient Hospital Stay (HOSPITAL_COMMUNITY): Payer: BC Managed Care – PPO

## 2013-11-22 DIAGNOSIS — M545 Low back pain, unspecified: Secondary | ICD-10-CM | POA: Diagnosis not present

## 2013-11-22 DIAGNOSIS — IMO0002 Reserved for concepts with insufficient information to code with codable children: Secondary | ICD-10-CM | POA: Insufficient documentation

## 2013-11-22 DIAGNOSIS — R109 Unspecified abdominal pain: Secondary | ICD-10-CM | POA: Insufficient documentation

## 2013-11-22 DIAGNOSIS — M549 Dorsalgia, unspecified: Secondary | ICD-10-CM | POA: Insufficient documentation

## 2013-11-22 HISTORY — DX: Other specified health status: Z78.9

## 2013-11-22 LAB — URINALYSIS, ROUTINE W REFLEX MICROSCOPIC
Bilirubin Urine: NEGATIVE
Glucose, UA: NEGATIVE mg/dL
Hgb urine dipstick: NEGATIVE
KETONES UR: NEGATIVE mg/dL
LEUKOCYTES UA: NEGATIVE
NITRITE: NEGATIVE
PROTEIN: NEGATIVE mg/dL
Specific Gravity, Urine: 1.025 (ref 1.005–1.030)
Urobilinogen, UA: 0.2 mg/dL (ref 0.0–1.0)
pH: 6 (ref 5.0–8.0)

## 2013-11-22 LAB — COMPREHENSIVE METABOLIC PANEL
ALK PHOS: 58 U/L (ref 39–117)
ALT: 12 U/L (ref 0–35)
ANION GAP: 10 (ref 5–15)
AST: 13 U/L (ref 0–37)
Albumin: 3.8 g/dL (ref 3.5–5.2)
BILIRUBIN TOTAL: 0.4 mg/dL (ref 0.3–1.2)
BUN: 9 mg/dL (ref 6–23)
CHLORIDE: 105 meq/L (ref 96–112)
CO2: 25 mEq/L (ref 19–32)
Calcium: 8.4 mg/dL (ref 8.4–10.5)
Creatinine, Ser: 0.58 mg/dL (ref 0.50–1.10)
GFR calc non Af Amer: >90 mL/min (ref >90)
GLUCOSE: 78 mg/dL (ref 70–99)
Potassium: 4 mEq/L (ref 3.7–5.3)
SODIUM: 140 meq/L (ref 137–147)
TOTAL PROTEIN: 6.8 g/dL (ref 6.0–8.3)

## 2013-11-22 LAB — CBC
HCT: 36.8 % (ref 36.0–46.0)
Hemoglobin: 12.2 g/dL (ref 12.0–15.0)
MCH: 32.2 pg (ref 26.0–34.0)
MCHC: 33.2 g/dL (ref 30.0–36.0)
MCV: 97.1 fL (ref 78.0–100.0)
PLATELETS: 189 10*3/uL (ref 150–400)
RBC: 3.79 MIL/uL — ABNORMAL LOW (ref 3.87–5.11)
RDW: 11.8 % (ref 11.5–15.5)
WBC: 5.2 10*3/uL (ref 4.0–10.5)

## 2013-11-22 LAB — WET PREP, GENITAL
Clue Cells Wet Prep HPF POC: NONE SEEN
TRICH WET PREP: NONE SEEN
Yeast Wet Prep HPF POC: NONE SEEN

## 2013-11-22 LAB — POCT PREGNANCY, URINE: Preg Test, Ur: NEGATIVE

## 2013-11-22 LAB — HIV ANTIBODY (ROUTINE TESTING W REFLEX): HIV 1&2 Ab, 4th Generation: NONREACTIVE

## 2013-11-22 MED ORDER — PREDNISONE 10 MG PO TABS: 20.0000 mg | ORAL_TABLET | Freq: Every day | ORAL | Status: DC

## 2013-11-22 MED ORDER — IOHEXOL 300 MG/ML  SOLN
100.0000 mL | Freq: Once | INTRAMUSCULAR | Status: AC | PRN
  Administered 2013-11-22: 100 mL via INTRAVENOUS

## 2013-11-22 MED ORDER — KETOROLAC TROMETHAMINE 60 MG/2ML IM SOLN
60.0000 mg | Freq: Once | INTRAMUSCULAR | Status: AC
  Administered 2013-11-22: 60 mg via INTRAMUSCULAR
  Filled 2013-11-22: qty 2

## 2013-11-22 MED ORDER — LACTATED RINGERS IV BOLUS (SEPSIS)
1000.0000 mL | Freq: Once | INTRAVENOUS | Status: AC
  Administered 2013-11-22: 1000 mL via INTRAVENOUS

## 2013-11-22 MED ORDER — IOHEXOL 300 MG/ML  SOLN: 50.0000 mL | INTRAMUSCULAR | Status: AC

## 2013-11-22 NOTE — Discharge Instructions (Signed)
Back Pain, Adult Low back pain is very common. About 1 in 5 people have back pain.The cause of low back pain is rarely dangerous. The pain often gets better over time.About half of people with a sudden onset of back pain feel better in just 2 weeks. About 8 in 10 people feel better by 6 weeks.  CAUSES Some common causes of back pain include:  Strain of the muscles or ligaments supporting the spine.  Wear and tear (degeneration) of the spinal discs.  Arthritis.  Direct injury to the back. DIAGNOSIS Most of the time, the direct cause of low back pain is not known.However, back pain can be treated effectively even when the exact cause of the pain is unknown.Answering your caregiver's questions about your overall health and symptoms is one of the most accurate ways to make sure the cause of your pain is not dangerous. If your caregiver needs more information, he or she may order lab work or imaging tests (X-rays or MRIs).However, even if imaging tests show changes in your back, this usually does not require surgery. HOME CARE INSTRUCTIONS For many people, back pain returns.Since low back pain is rarely dangerous, it is often a condition that people can learn to manageon their own.   Remain active. It is stressful on the back to sit or stand in one place. Do not sit, drive, or stand in one place for more than 30 minutes at a time. Take short walks on level surfaces as soon as pain allows.Try to increase the length of time you walk each day.  Do not stay in bed.Resting more than 1 or 2 days can delay your recovery.  Do not avoid exercise or work.Your body is made to move.It is not dangerous to be active, even though your back may hurt.Your back will likely heal faster if you return to being active before your pain is gone.  Pay attention to your body when you bend and lift. Many people have less discomfortwhen lifting if they bend their knees, keep the load close to their bodies,and  avoid twisting. Often, the most comfortable positions are those that put less stress on your recovering back.  Find a comfortable position to sleep. Use a firm mattress and lie on your side with your knees slightly bent. If you lie on your back, put a pillow under your knees.  Only take over-the-counter or prescription medicines as directed by your caregiver. Over-the-counter medicines to reduce pain and inflammation are often the most helpful.Your caregiver may prescribe muscle relaxant drugs.These medicines help dull your pain so you can more quickly return to your normal activities and healthy exercise.  Put ice on the injured area.  Put ice in a plastic bag.  Place a towel between your skin and the bag.  Leave the ice on for 15-20 minutes, 03-04 times a day for the first 2 to 3 days. After that, ice and heat may be alternated to reduce pain and spasms.  Ask your caregiver about trying back exercises and gentle massage. This may be of some benefit.  Avoid feeling anxious or stressed.Stress increases muscle tension and can worsen back pain.It is important to recognize when you are anxious or stressed and learn ways to manage it.Exercise is a great option. SEEK MEDICAL CARE IF:  You have pain that is not relieved with rest or medicine.  You have pain that does not improve in 1 week.  You have new symptoms.  You are generally not feeling well. SEEK   IMMEDIATE MEDICAL CARE IF:   You have pain that radiates from your back into your legs.  You develop new bowel or bladder control problems.  You have unusual weakness or numbness in your arms or legs.  You develop nausea or vomiting.  You develop abdominal pain.  You feel faint. Document Released: 04/06/2005 Document Revised: 10/06/2011 Document Reviewed: 08/08/2013 Exit[employer] Patient Information 2015 [employer]. This information is not intended to replace advice given to you by your health care provider. Make sure you  discuss any questions you have with your health care provider.  

## 2013-11-22 NOTE — MAU Note (Addendum)
Pt reports she has had back pain on her left side for several months now has been worked up for it back in Feb. Pt not sure what they found exept her bHCG level was elevated. Ain is getting worse.

## 2013-11-22 NOTE — MAU Provider Note (Signed)
History     CSN: [id number]  Arrival date and time: 11/22/13 1138   First Provider Initiated Contact with Patient 11/22/13 1301      Chief Complaint  Patient presents with  . Back Pain   HPI Comments: Maria Hoffman 33 y.o. [id number] presents to MAU with left flank pain that has been ongoing  Since February 2015. She was seen back then and had an ultrasound which was negative. There was some concern then that she may have been early pregnant. She has not followed up in any way due to lack of insurance. She rates the pain as 9/10 and has taken no medications. She says that it seems to be in her left back and at times radiates to abdomen and legs. She also has a sensation that something is being displaced in her rectal area. The pain is more noticeable when she is sitting, but also bothers her when she is moving. It is worse today. She is sexually active with same partner and uses condoms. LMP 10/30/13 and she has no issues with menses.     Back Pain Associated symptoms include abdominal pain.      Past Medical History  Diagnosis Date  . Medical history non-contributory     Past Surgical History  Procedure Laterality Date  . No past surgeries      History reviewed. No pertinent family history.  History  Substance Use Topics  . Smoking status: Never Smoker   . Smokeless tobacco: Not on file  . Alcohol Use: No    Allergies:  Allergies  Allergen Reactions  . Flagyl [Metronidazole] Other (See Comments)    Makes dark spots on skin  . Sulfa Antibiotics     Makes dark marks on skin    Prescriptions prior to admission  Medication Sig Dispense Refill  . acetaminophen (TYLENOL) 325 MG tablet Take 650 mg by mouth every 6 (six) hours as needed for moderate pain.        Review of Systems  Constitutional: Negative.   HENT: Negative.   Eyes: Negative.   Respiratory: Negative.   Cardiovascular: Negative.   Gastrointestinal: Positive for abdominal pain.  Genitourinary:  Negative.   Musculoskeletal: Positive for back pain.  Skin: Negative.   Neurological: Negative.   Psychiatric/Behavioral: Negative.    Physical Exam   Blood pressure 114/81, pulse 69, temperature 98.8 F (37.1 C), temperature source Oral, resp. rate 18, height 5\' 6"  (1.676 m), weight 75.206 kg (165 lb 12.8 oz), last menstrual period 10/30/2013.  Physical Exam  Constitutional: She is oriented to person, place, and time. She appears well-developed and well-nourished.  Seems uncomfortable  HENT:  Head: Normocephalic and atraumatic.  Eyes: Pupils are equal, round, and reactive to light.  Cardiovascular: Normal rate, regular rhythm and normal heart sounds.   Respiratory: Effort normal and breath sounds normal.  GI: Soft. Bowel sounds are normal. She exhibits no distension. There is tenderness. There is no rebound and no guarding.  Genitourinary:  Genital:external negative Vaginal:small amount creamy discharge Cervix:closed/ thick/ no CMT Bimanual:Nontender uterus/ no adnexal mass Rectal: No mass appreciated/ no stool in vault   Musculoskeletal: She exhibits tenderness.  Left flank tenderness to palpation  Neurological: She is alert and oriented to person, place, and time.  Skin: Skin is warm and dry.  Psychiatric: She has a normal mood and affect. Her behavior is normal. Judgment and thought content normal.   Results for orders placed during the hospital encounter of 11/22/13 (from the past 24  hour(s))  URINALYSIS, ROUTINE W REFLEX MICROSCOPIC     Status: None   Collection Time    11/22/13 11:55 AM      Result Value Ref Range   Color, Urine YELLOW  YELLOW   APPearance CLEAR  CLEAR   Specific Gravity, Urine 1.025  1.005 - 1.030   pH 6.0  5.0 - 8.0   Glucose, UA NEGATIVE  NEGATIVE mg/dL   Hgb urine dipstick NEGATIVE  NEGATIVE   Bilirubin Urine NEGATIVE  NEGATIVE   Ketones, ur NEGATIVE  NEGATIVE mg/dL   Protein, ur NEGATIVE  NEGATIVE mg/dL   Urobilinogen, UA 0.2  0.0 - 1.0  mg/dL   Nitrite NEGATIVE  NEGATIVE   Leukocytes, UA NEGATIVE  NEGATIVE  POCT PREGNANCY, URINE     Status: None   Collection Time    11/22/13 12:20 PM      Result Value Ref Range   Preg Test, Ur NEGATIVE  NEGATIVE  WET PREP, GENITAL     Status: Abnormal   Collection Time    11/22/13  1:31 PM      Result Value Ref Range   Yeast Wet Prep HPF POC NONE SEEN  NONE SEEN   Trich, Wet Prep NONE SEEN  NONE SEEN   Clue Cells Wet Prep HPF POC NONE SEEN  NONE SEEN   WBC, Wet Prep HPF POC MODERATE (*) NONE SEEN  CBC     Status: Abnormal   Collection Time    11/22/13  1:41 PM      Result Value Ref Range   WBC 5.2  4.0 - 10.5 K/uL   RBC 3.79 (*) 3.87 - 5.11 MIL/uL   Hemoglobin 12.2  12.0 - 15.0 g/dL   HCT 32.336.8  55.736.0 - 32.246.0 %   MCV 97.1  78.0 - 100.0 fL   MCH 32.2  26.0 - 34.0 pg   MCHC 33.2  30.0 - 36.0 g/dL   RDW 02.511.8  42.711.5 - 06.215.5 %   Platelets 189  150 - 400 K/uL  COMPREHENSIVE METABOLIC PANEL     Status: None   Collection Time    11/22/13  1:41 PM      Result Value Ref Range   Sodium 140  137 - 147 mEq/L   Potassium 4.0  3.7 - 5.3 mEq/L   Chloride 105  96 - 112 mEq/L   CO2 25  19 - 32 mEq/L   Glucose, Bld 78  70 - 99 mg/dL   BUN 9  6 - 23 mg/dL   Creatinine, Ser 3.760.58  0.50 - 1.10 mg/dL   Calcium 8.4  8.4 - 28.310.5 mg/dL   Total Protein 6.8  6.0 - 8.3 g/dL   Albumin 3.8  3.5 - 5.2 g/dL   AST 13  0 - 37 U/L   ALT 12  0 - 35 U/L   Alkaline Phosphatase 58  39 - 117 U/L   Total Bilirubin 0.4  0.3 - 1.2 mg/dL   GFR calc non Af Amer >90  >90 mL/min   GFR calc Af Amer >90  >90 mL/min   Anion gap 10  5 - 15   Ct Abdomen Pelvis W Contrast  11/22/2013   CLINICAL DATA:  Left lower flank pain.  EXAM: CT ABDOMEN AND PELVIS WITH CONTRAST  TECHNIQUE: Multidetector CT imaging of the abdomen and pelvis was performed using the standard protocol following bolus administration of intravenous contrast.  CONTRAST:  100mL OMNIPAQUE IOHEXOL 300 MG/ML  SOLN  COMPARISON:  None.  FINDINGS: Lung bases are  clear.  No effusions.  Heart is normal size.  Liver, gallbladder, spleen, pancreas, adrenals and kidneys are unremarkable. Uterus, ovaries and urinary bladder have a normal appearance. Stomach, large and small bowel unremarkable. Appendix is not visualized, but no inflammatory process in the right lower quadrant. No free fluid, free air or adenopathy. Aorta is normal caliber.  Disc space narrowing noted at L5-S1. Disc bulges noted at L4-5 and L5-S1. No acute bony abnormality.  IMPRESSION: No acute findings in the abdomen or pelvis.  Broad-based disc bulges at L4-5 and L5-S1.   Electronically Signed   By: Charlett Nose M.D.   On: 11/22/2013 15:36    MAU Course  Procedures  MDM Declines anything for pain at this time CBC, CMET, HIV, UA CT of Abdomen and pelvis with and without contrast IV LR Toradol 60 mg IM Assessment and Plan   A: Back pain with bulging disc at L4-5 S1  P: Referral to [facility] Friday at 10 am with Dr Roda Shutters Prednisone taper  Return to MAU as needed  Carolynn Serve 11/22/2013, 2:44 PM

## 2013-11-23 LAB — GC/CHLAMYDIA PROBE AMP
CT PROBE, AMP APTIMA: NEGATIVE
GC Probe RNA: NEGATIVE

## 2013-11-23 NOTE — MAU Provider Note (Signed)
Attestation of Attending Supervision of Advanced Practitioner (CNM/NP): Evaluation and management procedures were performed by the Advanced Practitioner under my supervision and collaboration.  I have reviewed the Advanced Practitioner's note and chart, and I agree with the management and plan.  HARRAWAY-SMITH, Jahlon Baines 9:02 AM     

## 2014-01-18 ENCOUNTER — Encounter (HOSPITAL_COMMUNITY): Payer: Self-pay | Admitting: Emergency Medicine

## 2014-01-18 ENCOUNTER — Emergency Department (HOSPITAL_COMMUNITY)
Admission: EM | Admit: 2014-01-18 | Discharge: 2014-01-18 | Disposition: A | Discharge status: Home / Self Care | Payer: BC Managed Care – PPO | Attending: Emergency Medicine | Admitting: Emergency Medicine

## 2014-01-18 DIAGNOSIS — M542 Cervicalgia: Secondary | ICD-10-CM

## 2014-01-18 DIAGNOSIS — S3982XA Other specified injuries of lower back, initial encounter: Secondary | ICD-10-CM | POA: Insufficient documentation

## 2014-01-18 DIAGNOSIS — Y92410 Unspecified street and highway as the place of occurrence of the external cause: Secondary | ICD-10-CM | POA: Insufficient documentation

## 2014-01-18 DIAGNOSIS — G4489 Other headache syndrome: Secondary | ICD-10-CM | POA: Insufficient documentation

## 2014-01-18 DIAGNOSIS — S199XXA Unspecified injury of neck, initial encounter: Secondary | ICD-10-CM | POA: Insufficient documentation

## 2014-01-18 DIAGNOSIS — S0990XA Unspecified injury of head, initial encounter: Secondary | ICD-10-CM | POA: Insufficient documentation

## 2014-01-18 DIAGNOSIS — Y9389 Activity, other specified: Secondary | ICD-10-CM | POA: Insufficient documentation

## 2014-01-18 MED ORDER — CYCLOBENZAPRINE HCL 10 MG PO TABS: 10.0000 mg | ORAL_TABLET | Freq: Two times a day (BID) | ORAL | Status: DC | PRN

## 2014-01-18 MED ORDER — TRAMADOL HCL 50 MG PO TABS
50.0000 mg | ORAL_TABLET | Freq: Four times a day (QID) | ORAL | Status: DC | PRN
Start: 2014-01-18 — End: 2017-02-23

## 2014-01-18 NOTE — Discharge Instructions (Signed)
Take Tramadol as needed for pain. Take Flexeril as needed for muscle spasm. Refer to attached documents for more information. Return to the ED with worsening or concerning symptoms.  °
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
## 2014-01-22 NOTE — ED Provider Notes (Signed)
Medical screening examination/treatment/procedure(s) were performed by non-physician practitioner and as supervising physician I was immediately available for consultation/collaboration.  Shmuel Girgis T Fidencio Duddy, MD 01/22/14 1028 

## 2014-02-02 ENCOUNTER — Emergency Department (HOSPITAL_COMMUNITY)
Admission: EM | Admit: 2014-02-02 | Discharge: 2014-02-02 | Disposition: A | Discharge status: Home / Self Care | Payer: BC Managed Care – PPO | Attending: Emergency Medicine | Admitting: Emergency Medicine

## 2014-02-02 DIAGNOSIS — S161XXD Strain of muscle, fascia and tendon at neck level, subsequent encounter: Secondary | ICD-10-CM | POA: Insufficient documentation

## 2014-02-02 DIAGNOSIS — G44309 Post-traumatic headache, unspecified, not intractable: Secondary | ICD-10-CM

## 2014-02-02 DIAGNOSIS — G44301 Post-traumatic headache, unspecified, intractable: Secondary | ICD-10-CM | POA: Insufficient documentation

## 2014-02-02 DIAGNOSIS — S39012D Strain of muscle, fascia and tendon of lower back, subsequent encounter: Secondary | ICD-10-CM | POA: Insufficient documentation

## 2014-02-02 NOTE — Discharge Instructions (Signed)
Continue flexeril. Take ibuprofen or naproxen daily for pain, it is an anti inflammatory, and may help with some inflammation. Try heating pads, gentle massage. Follow up with neurosurgery for an outpatient MRI.    Cervical Sprain A cervical sprain is an injury in the neck in which the strong, fibrous tissues (ligaments) that connect your neck bones stretch or tear. Cervical sprains can range from mild to severe. Severe cervical sprains can cause the neck vertebrae to be unstable. This can lead to damage of the spinal cord and can result in serious nervous system problems. The amount of time it takes for a cervical sprain to get better depends on the cause and extent of the injury. Most cervical sprains heal in 1 to 3 weeks. CAUSES  Severe cervical sprains may be caused by:   Contact sport injuries (such as from football, rugby, wrestling, hockey, auto racing, gymnastics, diving, martial arts, or boxing).   Motor vehicle collisions.   Whiplash injuries. This is an injury from a sudden forward and backward whipping movement of the head and neck.  Falls.  Mild cervical sprains may be caused by:   Being in an awkward position, such as while cradling a telephone between your ear and shoulder.   Sitting in a chair that does not offer proper support.   Working at a poorly Marketing executivedesigned computer station.   Looking up or down for long periods of time.  SYMPTOMS   Pain, soreness, stiffness, or a burning sensation in the front, back, or sides of the neck. This discomfort may develop immediately after the injury or slowly, 24 hours or more after the injury.   Pain or tenderness directly in the middle of the back of the neck.   Shoulder or upper back pain.   Limited ability to move the neck.   Headache.   Dizziness.   Weakness, numbness, or tingling in the hands or arms.   Muscle spasms.   Difficulty swallowing or chewing.   Tenderness and swelling of the neck.   DIAGNOSIS  Most of the time your health care provider can diagnose a cervical sprain by taking your history and doing a physical exam. Your health care provider will ask about previous neck injuries and any known neck problems, such as arthritis in the neck. X-rays may be taken to find out if there are any other problems, such as with the bones of the neck. Other tests, such as a CT scan or MRI, may also be needed.  TREATMENT  Treatment depends on the severity of the cervical sprain. Mild sprains can be treated with rest, keeping the neck in place (immobilization), and pain medicines. Severe cervical sprains are immediately immobilized. Further treatment is done to help with pain, muscle spasms, and other symptoms and may include:  Medicines, such as pain relievers, numbing medicines, or muscle relaxants.   Physical therapy. This may involve stretching exercises, strengthening exercises, and posture training. Exercises and improved posture can help stabilize the neck, strengthen muscles, and help stop symptoms from returning.  HOME CARE INSTRUCTIONS   Put ice on the injured area.   Put ice in a plastic bag.   Place a towel between your skin and the bag.   Leave the ice on for 15-20 minutes, 3-4 times a day.   If your injury was severe, you may have been given a cervical collar to wear. A cervical collar is a two-piece collar designed to keep your neck from moving while it heals.  Do not  remove the collar unless instructed by your health care provider.  If you have long hair, keep it outside of the collar.  Ask your health care provider before making any adjustments to your collar. Minor adjustments may be required over time to improve comfort and reduce pressure on your chin or on the back of your head.  Ifyou are allowed to remove the collar for cleaning or bathing, follow your health care provider's instructions on how to do so safely.  Keep your collar clean by wiping it with  mild soap and water and drying it completely. If the collar you have been given includes removable pads, remove them every 1-2 days and hand wash them with soap and water. Allow them to air dry. They should be completely dry before you wear them in the collar.  If you are allowed to remove the collar for cleaning and bathing, wash and dry the skin of your neck. Check your skin for irritation or sores. If you see any, tell your health care provider.  Do not drive while wearing the collar.   Only take over-the-counter or prescription medicines for pain, discomfort, or fever as directed by your health care provider.   Keep all follow-up appointments as directed by your health care provider.   Keep all physical therapy appointments as directed by your health care provider.   Make any needed adjustments to your workstation to promote good posture.   Avoid positions and activities that make your symptoms worse.   Warm up and stretch before being active to help prevent problems.  SEEK MEDICAL CARE IF:   Your pain is not controlled with medicine.   You are unable to decrease your pain medicine over time as planned.   Your activity level is not improving as expected.  SEEK IMMEDIATE MEDICAL CARE IF:   You develop any bleeding.  You develop stomach upset.  You have signs of an allergic reaction to your medicine.   Your symptoms get worse.   You develop new, unexplained symptoms.   You have numbness, tingling, weakness, or paralysis in any part of your body.  MAKE SURE YOU:   Understand these instructions.  Will watch your condition.  Will get help right away if you are not doing well or get worse. Document Released: 02/01/2007 Document Revised: 04/11/2013 Document Reviewed: 10/12/2012 [facility] Patient Information 2015 [place], [place]. This information is not intended to replace advice given to you by your health care provider. Make sure you discuss any questions you  have with your health care provider.  Lumbosacral Strain Lumbosacral strain is a strain of any of the parts that make up your lumbosacral vertebrae. Your lumbosacral vertebrae are the bones that make up the lower third of your backbone. Your lumbosacral vertebrae are held together by muscles and tough, fibrous tissue (ligaments).  CAUSES  A sudden blow to your back can cause lumbosacral strain. Also, anything that causes an excessive stretch of the muscles in the low back can cause this strain. This is typically seen when people exert themselves strenuously, fall, lift heavy objects, bend, or crouch repeatedly. RISK FACTORS  Physically demanding work.  Participation in pushing or pulling sports or sports that require a sudden twist of the back (tennis, golf, baseball).  Weight lifting.  Excessive lower back curvature.  Forward-tilted pelvis.  Weak back or abdominal muscles or both.  Tight hamstrings. SIGNS AND SYMPTOMS  Lumbosacral strain may cause pain in the area of your injury or pain that moves (radiates)  down your leg.  DIAGNOSIS Your health care provider can often diagnose lumbosacral strain through a physical exam. In some cases, you may need tests such as X-ray exams.  TREATMENT  Treatment for your lower back injury depends on many factors that your clinician will have to evaluate. However, most treatment will include the use of anti-inflammatory medicines. HOME CARE INSTRUCTIONS   Avoid hard physical activities (tennis, racquetball, waterskiing) if you are not in proper physical condition for it. This may aggravate or create problems.  If you have a back problem, avoid sports requiring sudden body movements. Swimming and walking are generally safer activities.  Maintain good posture.  Maintain a healthy weight.  For acute conditions, you may put ice on the injured area.  Put ice in a plastic bag.  Place a towel between your skin and the bag.  Leave the ice on for  20 minutes, 2-3 times a day.  When the low back starts healing, stretching and strengthening exercises may be recommended. SEEK MEDICAL CARE IF:  Your back pain is getting worse.  You experience severe back pain not relieved with medicines. SEEK IMMEDIATE MEDICAL CARE IF:   You have numbness, tingling, weakness, or problems with the use of your arms or legs.  There is a change in bowel or bladder control.  You have increasing pain in any area of the body, including your belly (abdomen).  You notice shortness of breath, dizziness, or feel faint.  You feel sick to your stomach (nauseous), are throwing up (vomiting), or become sweaty.  You notice discoloration of your toes or legs, or your feet get very cold. MAKE SURE YOU:   Understand these instructions.  Will watch your condition.  Will get help right away if you are not doing well or get worse. Document Released: 01/14/2005 Document Revised: 04/11/2013 Document Reviewed: 11/23/2012 [facility] Patient Information 2015 [place]. This information is not intended to replace advice given to you by your health care provider. Make sure you discuss any questions you have with your health care provider.
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
## 2014-02-05 NOTE — ED Provider Notes (Signed)
Medical screening examination/treatment/procedure(s) were performed by non-physician practitioner and as supervising physician I was immediately available for consultation/collaboration.   EKG Interpretation None        Estefano Victory, MD 02/05/14 0707 

## 2014-02-19 ENCOUNTER — Encounter (HOSPITAL_COMMUNITY): Payer: Self-pay | Admitting: Emergency Medicine

## 2015-03-26 IMAGING — CT CT ABD-PELV W/ CM
1 of 2 series · 16 of 32 positions shown, 20 images · IV contrast (OMNIPAQUE)
Comparison: None.

CLINICAL DATA: Left lower flank pain.

EXAM:
CT ABDOMEN AND PELVIS WITH CONTRAST
TECHNIQUE: Multidetector CT imaging of the abdomen and pelvis was performed
using the standard protocol following bolus administration of
intravenous contrast.
CONTRAST:  100mL OMNIPAQUE IOHEXOL 300 MG/ML  SOLN

[Series 2: routine abdomen/pelvis with · axial · 0.83mm/px · z∈[-619,-214]mm · 16 of 89 slices shown, 20 images]
[im 4/89  soft-tissue]
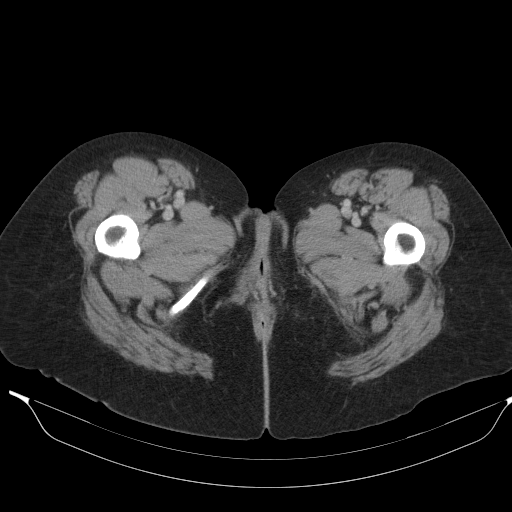
[im 4/89  bone]
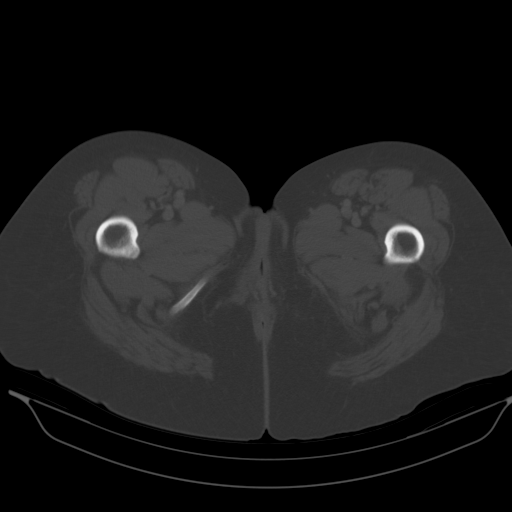
[im 11/89  soft-tissue]
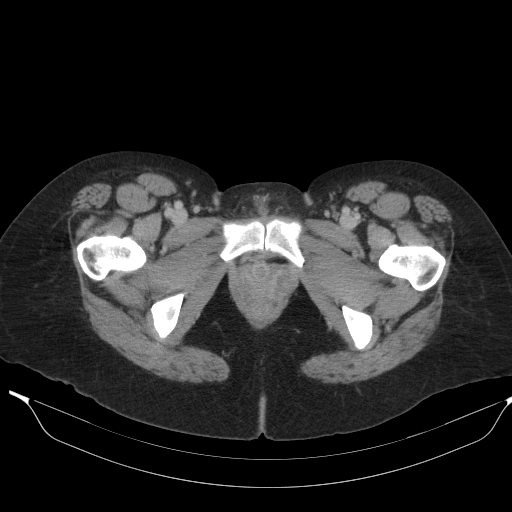
[im 18/89  soft-tissue]
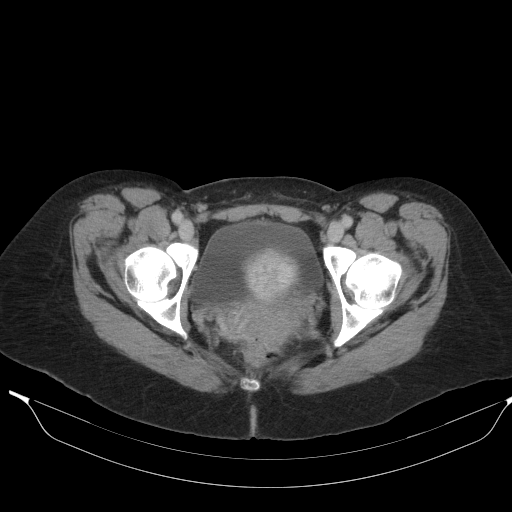
[im 25/89  soft-tissue]
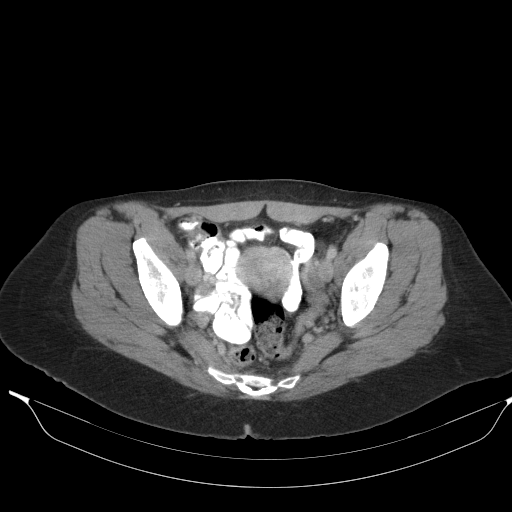
[im 29/89  soft-tissue]
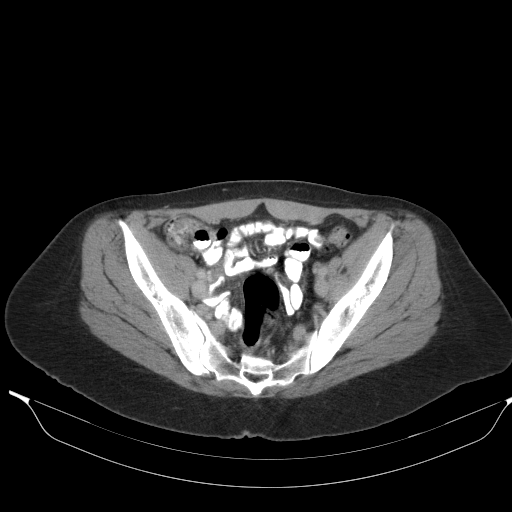
[im 36/89  soft-tissue]
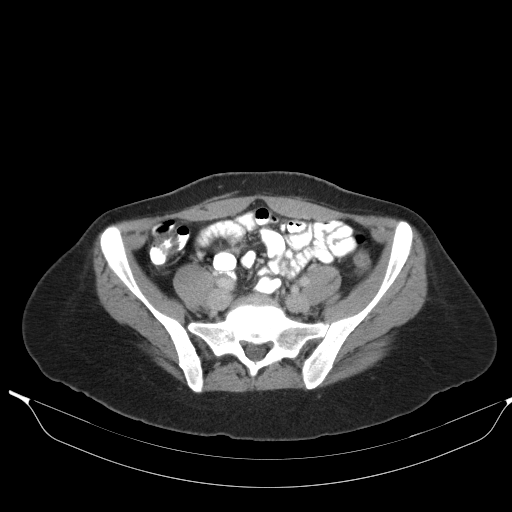
[im 43/89  soft-tissue]
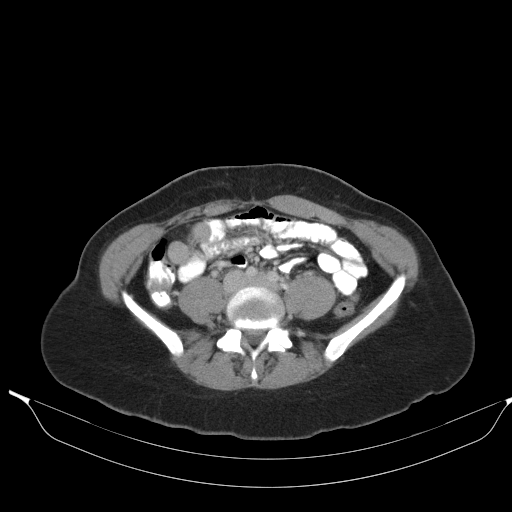
[im 46/89  soft-tissue]
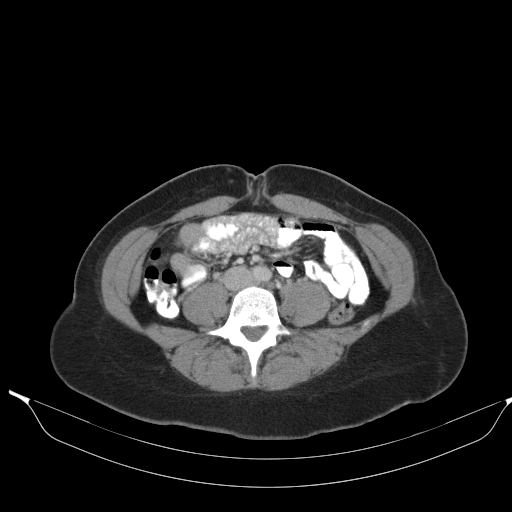
[im 53/89  soft-tissue]
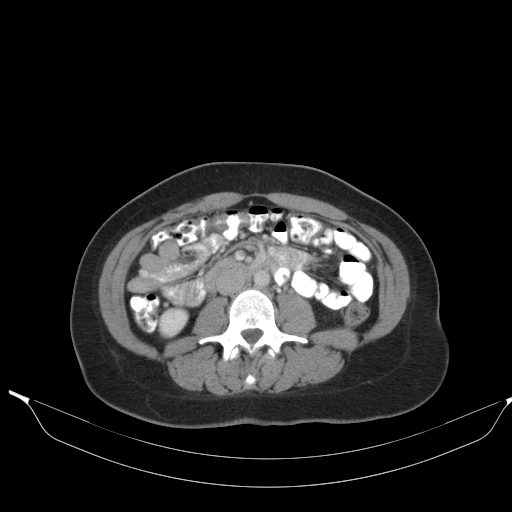
[im 53/89  bone]
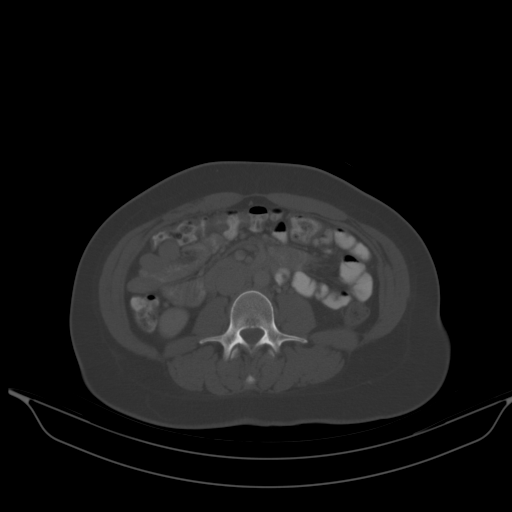
[im 60/89  soft-tissue]
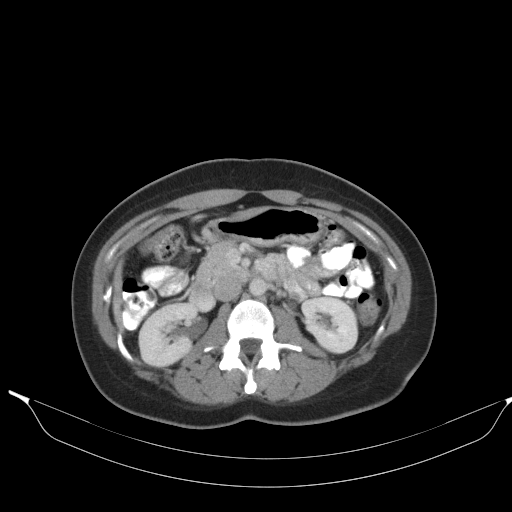
[im 67/89  soft-tissue]
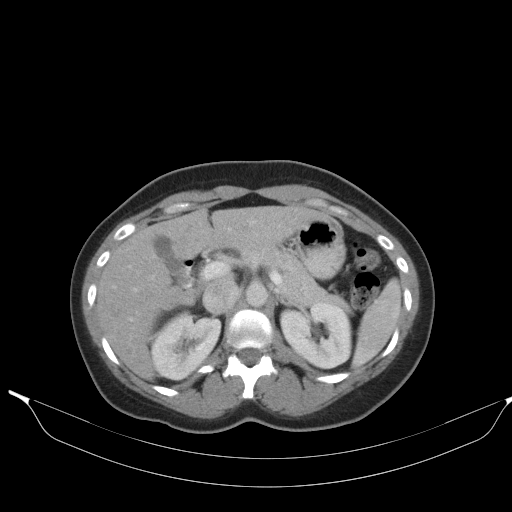
[im 71/89  soft-tissue]
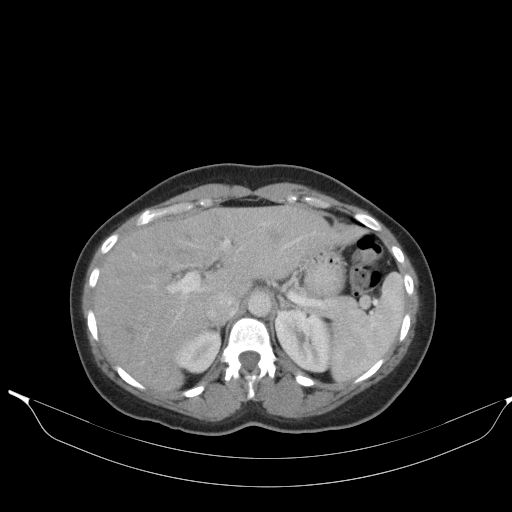
[im 74/89  lung]
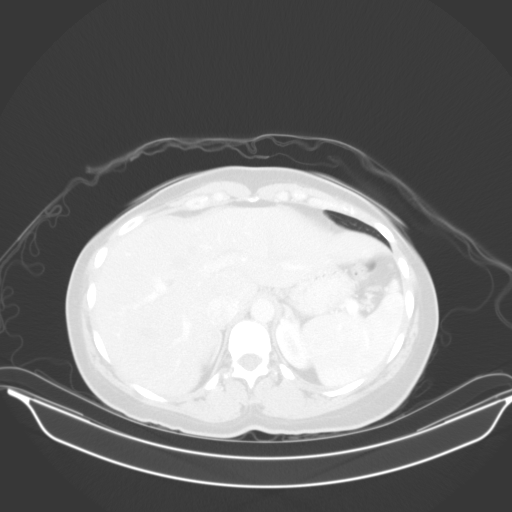
[im 78/89  soft-tissue]
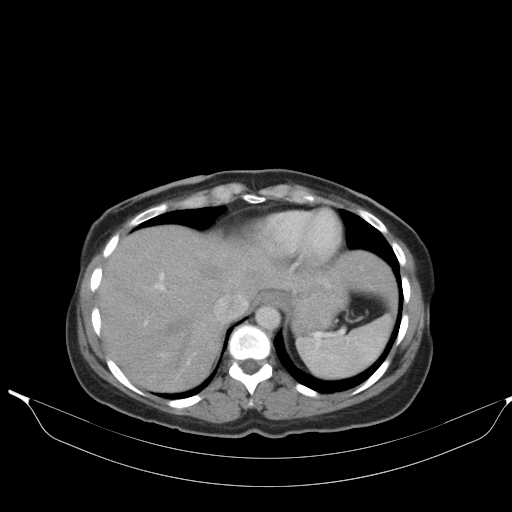
[im 78/89  lung]
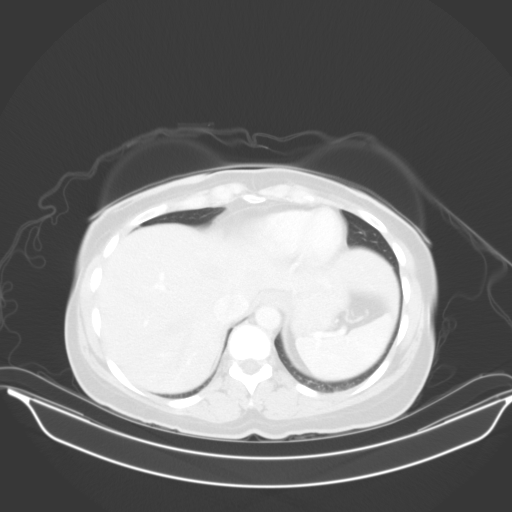
[im 81/89  lung]
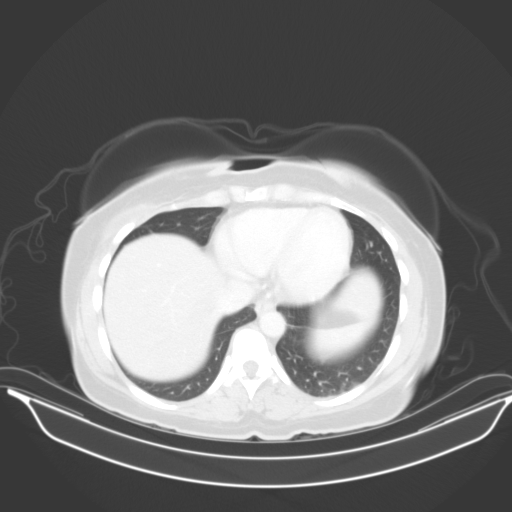
[im 85/89  soft-tissue]
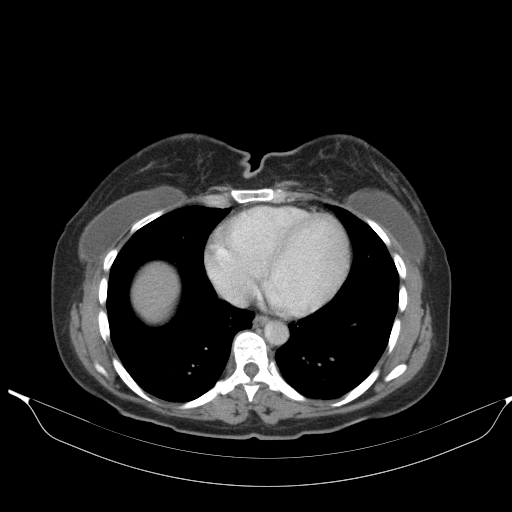
[im 85/89  lung]
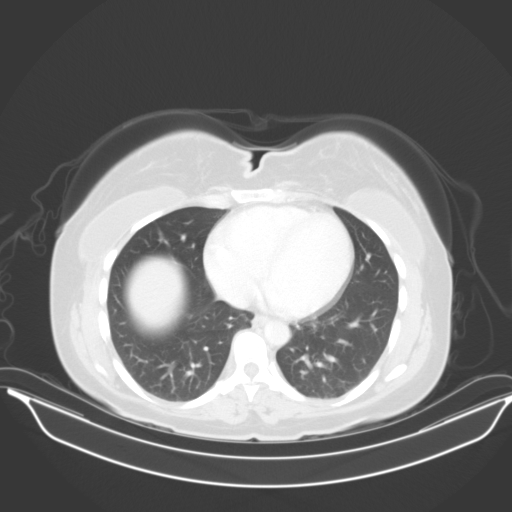

[16 of 32 positions shown; findings below may reference images not displayed]

FINDINGS: Lung bases are clear.  No effusions.  Heart is normal size.

Liver, gallbladder, spleen, pancreas, adrenals and kidneys are
unremarkable. Uterus, ovaries and urinary bladder have a normal
appearance. Stomach, large and small bowel unremarkable. Appendix is
not visualized, but no inflammatory process in the right lower
quadrant. No free fluid, free air or adenopathy. Aorta is normal
caliber.

Disc space narrowing noted at L5-S1. Disc bulges noted at L4-5 and
L5-S1. No acute bony abnormality.
IMPRESSION: No acute findings in the abdomen or pelvis.

Broad-based disc bulges at L4-5 and L5-S1.

## 2017-02-23 ENCOUNTER — Ambulatory Visit (HOSPITAL_COMMUNITY)
Admission: EM | Admit: 2017-02-23 | Discharge: 2017-02-23 | Disposition: A | Discharge status: Home / Self Care | Payer: Self-pay | Attending: Emergency Medicine | Admitting: Emergency Medicine

## 2017-02-23 ENCOUNTER — Encounter (HOSPITAL_COMMUNITY): Payer: Self-pay | Admitting: Emergency Medicine

## 2017-02-23 DIAGNOSIS — S161XXA Strain of muscle, fascia and tendon at neck level, initial encounter: Secondary | ICD-10-CM

## 2017-02-23 MED ORDER — METHOCARBAMOL 750 MG PO TABS: 750.0000 mg | ORAL_TABLET | ORAL | 0 refills | Status: DC

## 2017-02-23 MED ORDER — DICLOFENAC SODIUM 75 MG PO TBEC: 75.0000 mg | DELAYED_RELEASE_TABLET | Freq: Two times a day (BID) | ORAL | 0 refills | Status: DC
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
## 2017-02-23 NOTE — Discharge Instructions (Signed)
People tend to feel worse over the next several days, but most people are back to normal in 1 week. A small number of people will have persistent pain for up to six weeks. Take the diclofenac on a regular basis as directed. You may take up to 1 gram of tylenol 4 times a day. This with the NSAID is an extremely effective combination for pain. Do not exceed 4 grams of tylenol per day from all sources.    Some people may require physical therapy. Early range of motion neck exercises has been shown to speed recovery. Start doing them as soon as possible. Start doing small range and amplitude movements of your neck, first in one direction, then the other. Repeat this 10 times in each direction every hour while awake. Do these to the maximum comfortable range. You may do this sitting up or lying down.  Below is a list of primary care practices who are taking new patients for you to follow-up with. [facility] [address] [place], [place] [address] [phone]  [facility] [phone] [address] [place] [address]  [facility]: [address] [place] [id number]  [phone]  [facility]: [address] [place] [id number]   [phone]  [facility]: [address] [place] [address]  [phone]  [facility] : [address] [place] [id number] [phone]  [facility]: [address] [place] [phone]  [facility]: [address] [place] [phone]  Dr. Oneal GroutMahima Pandey [address] [facility] [facility] [place] [id number]  [phone]  Dr. Jackie PlumGeorge Osei-Bonsu, [facility]. [address] [place], [place] [address]    [phone]  Go to www.goodrx.com to look up your medications. This will give you a list of where you can find your prescriptions at the most affordable prices. Or ask the pharmacist what the cash price is, or if they have any other discount programs available to help make your medication more affordable. This can be less expensive than what you would pay with insurance.
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
## 2017-10-01 ENCOUNTER — Encounter (HOSPITAL_COMMUNITY): Payer: Self-pay | Admitting: Emergency Medicine

## 2017-10-01 ENCOUNTER — Ambulatory Visit (HOSPITAL_COMMUNITY)
Admission: EM | Admit: 2017-10-01 | Discharge: 2017-10-01 | Disposition: A | Discharge status: Home / Self Care | Payer: Self-pay | Attending: Family Medicine | Admitting: Family Medicine

## 2017-10-01 DIAGNOSIS — R21 Rash and other nonspecific skin eruption: Secondary | ICD-10-CM

## 2017-10-01 DIAGNOSIS — L247 Irritant contact dermatitis due to plants, except food: Secondary | ICD-10-CM

## 2017-10-01 MED ORDER — METHYLPREDNISOLONE SODIUM SUCC 125 MG IJ SOLR
INTRAMUSCULAR | Status: AC
  Filled 2017-10-01: qty 2

## 2017-10-01 MED ORDER — METHYLPREDNISOLONE SODIUM SUCC 125 MG IJ SOLR
125.0000 mg | Freq: Once | INTRAMUSCULAR | Status: AC
  Administered 2017-10-01: 125 mg via INTRAMUSCULAR

## 2017-10-01 MED ORDER — HYDROXYZINE HCL 25 MG PO TABS: 25.0000 mg | ORAL_TABLET | Freq: Four times a day (QID) | ORAL | 0 refills | Status: DC | PRN

## 2017-10-01 MED ORDER — PREDNISONE 10 MG PO TABS: 20.0000 mg | ORAL_TABLET | Freq: Two times a day (BID) | ORAL | 0 refills | Status: AC

## 2017-10-01 NOTE — ED Provider Notes (Signed)
[facility]   [id number] 10/01/17 Arrival Time: 1911  SUBJECTIVE:  Maria Hoffman is a 37 y.o. female who presents with a skin complaint that began on couple of weeks ago.  It began after she was exposed to poison ivy.  Rash is diffuse about the body including the arms, chest, and lower extremiites.  Describes it as itchy and spreading.  Has tried OTC medications without relief.  Symptoms are made worse with itching.  Reports similar symptoms in the past. Complains of erythema.  Denies fever, chills, nausea, vomiting, swollen glands, throat swelling, difficulty breathing, SOB, chest pain, abdominal pain, changes in bowel or bladder function.    ROS: As per HPI.  Past Medical History:  Diagnosis Date  . Medical history non-contributory    Past Surgical History:  Procedure Laterality Date  . BREAST ENHANCEMENT SURGERY    . NO PAST SURGERIES     Allergies  Allergen Reactions  . Flagyl [Metronidazole] Other (See Comments)    Makes dark spots on skin  . Sulfa Antibiotics     Makes dark marks on skin   No current facility-administered medications on file prior to encounter.    Current Outpatient Medications on File Prior to Encounter  Medication Sig Dispense Refill  . diclofenac (VOLTAREN) 75 MG EC tablet Take 1 tablet (75 mg total) 2 (two) times daily by mouth. Take with food (Patient not taking: Reported on 10/01/2017) 30 tablet 0  . methocarbamol (ROBAXIN) 750 MG tablet Take 1 tablet (750 mg total) every 4 (four) hours by mouth. (Patient not taking: Reported on 10/01/2017) 40 tablet 0   Social History   Socioeconomic History  . Marital status: Single    Spouse name: Not on file  . Number of children: Not on file  . Years of education: Not on file  . Highest education level: Not on file  Occupational History  . Not on file  Social Needs  . Financial resource strain: Not on file  . Food insecurity:    Worry: Not on file    Inability: Not on file  .  Transportation needs:    Medical: Not on file    Non-medical: Not on file  Tobacco Use  . Smoking status: Never Smoker  Substance and Sexual Activity  . Alcohol use: No  . Drug use: No  . Sexual activity: Yes    Birth control/protection: None  Lifestyle  . Physical activity:    Days per week: Not on file    Minutes per session: Not on file  . Stress: Not on file  Relationships  . Social connections:    Talks on phone: Not on file    Gets together: Not on file    Attends religious service: Not on file    Active member of club or organization: Not on file    Attends meetings of clubs or organizations: Not on file    Relationship status: Not on file  . Intimate partner violence:    Fear of current or ex partner: Not on file    Emotionally abused: Not on file    Physically abused: Not on file    Forced sexual activity: Not on file  Other Topics Concern  . Not on file  Social History Narrative  . Not on file   No family history on file.  OBJECTIVE: Vitals:   10/01/17 1929  BP: 130/90  Pulse: 65  Resp: 16  Temp: 98.6 F (37 C)  SpO2: 100%  General appearance: alert; no distress Lungs: clear to auscultation bilaterally Heart: regular rate and rhythm.  Radial pulse 2+ bilaterally Extremities: no edema Skin: warm and dry; maculopapular rash with surrounding erythema appreciated in a  linear distribution on left anterior forearm, small maculopapular rashes also diffuse about the right anterior forearm, and right lower extremity.   Psychological: alert and cooperative; normal mood and affect  ASSESSMENT & PLAN:  1. Contact dermatitis and eczema due to plant     Meds ordered this encounter  Medications  . methylPREDNISolone sodium succinate (SOLU-MEDROL) 125 mg/2 mL injection 125 mg  . predniSONE (DELTASONE) 10 MG tablet    Sig: Take 2 tablets (20 mg total) by mouth 2 (two) times daily with a meal for 5 days.    Dispense:  20 tablet    Refill:  0    Order  Specific Question:   Supervising Provider    Answer:   Isa Rankin [phone]  . hydrOXYzine (ATARAX/VISTARIL) 25 MG tablet    Sig: Take 1 tablet (25 mg total) by mouth every 6 (six) hours as needed for itching.    Dispense:  12 tablet    Refill:  0    Order Specific Question:   Supervising Provider    Answer:   Isa Rankin [[id number]]   Steroid shot given in office Prednisone prescribed.  Take as directed and to completion Hydroxyzine prescribed take as needed for symptomatic relief.  Avoid driving or operating heavy machinery while taking this medication Follow up with PCP if symptoms do not improve Return or go to the ER if you have any new or worsening symptoms  Reviewed expectations re: course of current medical issues. Questions answered. Outlined signs and symptoms indicating need for more acute intervention. Patient verbalized understanding. After Visit Summary given.   Rennis Harding, PA-C 10/01/17 2035

## 2017-10-01 NOTE — Discharge Instructions (Addendum)
Steroid shot given in office Prednisone prescribed.  Take as directed and to completion Hydroxyzine prescribed take as needed for symptomatic relief.  Avoid driving or operating heavy machinery while taking this medication Follow up with PCP if symptoms do not improve Return or go to the ER if you have any new or worsening symptoms

## 2017-10-01 NOTE — ED Triage Notes (Signed)
Pt c/o poison ivy rash all over body for several weeks.

## 2018-11-14 ENCOUNTER — Other Ambulatory Visit: Payer: Self-pay | Admitting: Obstetrics & Gynecology

## 2019-06-05 ENCOUNTER — Other Ambulatory Visit: Payer: Self-pay

## 2019-06-07 ENCOUNTER — Ambulatory Visit: Payer: Self-pay | Admitting: Endocrinology

## 2019-06-08 ENCOUNTER — Ambulatory Visit: Payer: 59 | Admitting: Endocrinology

## 2019-06-09 ENCOUNTER — Telehealth: Payer: Self-pay | Admitting: Endocrinology

## 2019-06-09 ENCOUNTER — Other Ambulatory Visit (INDEPENDENT_AMBULATORY_CARE_PROVIDER_SITE_OTHER): Payer: 59

## 2019-06-09 ENCOUNTER — Telehealth: Payer: Self-pay

## 2019-06-09 ENCOUNTER — Encounter: Payer: Self-pay | Admitting: Endocrinology

## 2019-06-09 ENCOUNTER — Other Ambulatory Visit: Payer: Self-pay

## 2019-06-09 ENCOUNTER — Ambulatory Visit: Payer: 59 | Admitting: Endocrinology

## 2019-06-09 VITALS — BP 130/70 | HR 124 | Ht 66.0 in | Wt 202.2 lb

## 2019-06-09 DIAGNOSIS — E049 Nontoxic goiter, unspecified: Secondary | ICD-10-CM

## 2019-06-09 DIAGNOSIS — E0590 Thyrotoxicosis, unspecified without thyrotoxic crisis or storm: Secondary | ICD-10-CM

## 2019-06-09 LAB — T4, FREE: Free T4: 5.18 ng/dL — ABNORMAL HIGH (ref 0.60–1.60)

## 2019-06-09 LAB — CBC
HCT: 33.4 % — ABNORMAL LOW (ref 36.0–46.0)
Hemoglobin: 10.7 g/dL — ABNORMAL LOW (ref 12.0–15.0)
MCHC: 32.1 g/dL (ref 30.0–36.0)
MCV: 84.3 fl (ref 78.0–100.0)
Platelets: 281 K/uL (ref 150.0–400.0)
RBC: 3.96 Mil/uL (ref 3.87–5.11)
RDW: 18.3 % — ABNORMAL HIGH (ref 11.5–15.5)
WBC: 4.5 K/uL (ref 4.0–10.5)

## 2019-06-09 LAB — T3, FREE: T3, Free: 25 pg/mL — ABNORMAL HIGH (ref 2.3–4.2)

## 2019-06-09 LAB — ALT: ALT: 48 U/L — ABNORMAL HIGH (ref 0–35)

## 2019-06-09 LAB — TSH: TSH: <0.01 u[IU]/mL — ABNORMAL LOW (ref 0.35–4.50)

## 2019-06-09 MED ORDER — METHIMAZOLE 10 MG PO TABS: 10.0000 mg | ORAL_TABLET | Freq: Two times a day (BID) | ORAL | 1 refills | Status: DC

## 2019-06-09 MED ORDER — METOPROLOL SUCCINATE ER 50 MG PO TB24: 50.0000 mg | ORAL_TABLET | Freq: Every day | ORAL | 0 refills | Status: DC

## 2019-06-09 NOTE — Telephone Encounter (Signed)
Rx sent does not match instructions in note. Please clarify.

## 2019-06-09 NOTE — Telephone Encounter (Signed)
Pt called after her new patient appt today and wanted to clarify the reason for being prescribed metoprolol. Pt was informed that the Metoprolol was prescribed because her over active thyroid was causing an increased heart rate and an increased heart rate for prolonged amounts of time can increase the risk for an arhythmia. Pt was informed that metoprolol will help lower her heart rate to reduce this risk. Pt verbalized understanding and she was urged to call back if she should have any questions.

## 2019-06-09 NOTE — Telephone Encounter (Signed)
Please let her know that her thyroid levels are extremely high.  She will need to start methimazole 10 mg, 2 tablets twice a day and keep her follow-up in 3 weeks

## 2019-06-09 NOTE — Progress Notes (Addendum)
Patient ID: Maria Hoffman, female   DOB: Dec 01, 1980, 39 y.o.   MRN: [id number]              Reason for Appointment: Evaluation of thyroid     History of Present Illness:   The patient is being referred by her PCP Dr.Koirala   For the last 3 to 4 weeks she has had symptoms of palpitations, shakiness, itching, weakness and fatigue. She feels her heart racing and it appears to be going fast all the time including when she is lying down at night She also will get a little out of breath when she is trying to be active She feels that when she is getting down or stooping she will have difficulty getting back up She also is having some rash on her back, itching She has some shakiness of her hands when she is trying to hold an object Overall she feels more tired Does not think she has excessive anxiety or nervousness She may feel warm at times but does not have any excessive heat consistently or any sweating Does not think she has lost any significant weight  She does have a known goiter since at least 2005, she does not know if this is swelling any more than usual  No results found for: FREET4, TSH  Allergies as of 06/09/2019      Reactions   Flagyl [metronidazole] Other (See Comments)   Makes dark spots on skin   Sulfa Antibiotics    Makes dark marks on skin      Medication List       Accurate as of June 09, 2019 11:54 AM. If you have any questions, ask your nurse or doctor.        STOP taking these medications   diclofenac 75 MG EC tablet Commonly known as: VOLTAREN Stopped by: Reather Littler, MD   hydrOXYzine 25 MG tablet Commonly known as: ATARAX/VISTARIL Stopped by: Reather Littler, MD   methocarbamol 750 MG tablet Commonly known as: ROBAXIN Stopped by: Reather Littler, MD     TAKE these medications   metoprolol succinate 50 MG 24 hr tablet Commonly known as: TOPROL-XL Take 1 tablet (50 mg total) by mouth daily. Take with or immediately following a meal. Started  by: Reather Littler, MD       Allergies:  Allergies  Allergen Reactions  . Flagyl [Metronidazole] Other (See Comments)    Makes dark spots on skin  . Sulfa Antibiotics     Makes dark marks on skin    Past Medical History:  Diagnosis Date  . Medical history non-contributory     HYPOTHYROIDISM: She had mild subclinical hyperthyroidism in the first year after her pregnancy in 2005 This was not requiring any treatment and resolved spontaneously  In 2009 she had a 2.3 cm thyroid nodule which on follow-up in 2012 had increased in size Needle aspiration biopsy in 2013 showed benign follicular nodule    Past Surgical History:  Procedure Laterality Date  . BREAST ENHANCEMENT SURGERY    . NO PAST SURGERIES      Family History  Problem Relation Age of Onset  . Diabetes Mother   . Hypothyroidism Mother   . Hypothyroidism Maternal Grandmother     Social History:  reports that she has never smoked. She has never used smokeless tobacco. She reports that she does not drink alcohol or use drugs.   Review of Systems  Constitutional: Negative for weight loss.  HENT: Positive for hoarseness.  Eyes: Negative for visual disturbance.  Respiratory: Positive for shortness of breath.   Cardiovascular: Positive for palpitations.  Gastrointestinal: Positive for diarrhea.  Endocrine: Positive for fatigue. Negative for heat intolerance.  Genitourinary: Negative for dysuria.  Musculoskeletal: Negative for joint pain.       She feels some tenderness around the ankles  Skin: Positive for rash and itching.  Neurological: Positive for weakness and tremors.  Psychiatric/Behavioral: Negative for nervousness.      Examination:   BP 130/70 (BP Location: Left Arm, Patient Position: Sitting, Cuff Size: Normal)   Pulse (!) 124   Ht 5\' 6"  (1.676 m)   Wt 202 lb 3.2 oz (91.7 kg)   SpO2 98%   BMI 32.64 kg/m   Well-built and nourished  General Appearance:  Does not appear anxious or  hyperkinetic, pleasant and cooperative       Eyes: No abnormal prominence or swelling of the eyes Minimal lid lag present          THYROID: She has a 3 times normal enlarged right lobe of the thyroid which is softer laterally Bruit audible over the thyroid on the right Thyroid is firm and about 3 times normal in the isthmus, may be a nodule Left lobe of the thyroid is about 1-1/2-2 times normal and smooth and slightly firm   There is no lymphadenopathy in the neck   Heart sounds normal without abnormal added heart sounds Lungs clear Abdomen shows no hepatosplenomegaly or other mass.     Neurological: Reflexes at biceps are brisk She has mild tremor of the outstretched hands especially on the left .  Skin: She has scattered slightly pigmented papular lesions on the upper back and mid back Some thickening of the skin on the right lower lateral part of the leg  Extremities: She has swelling and puffiness of the lower legs and feet, minimal pitting.   Assessment/Plan:  GOITER with recent symptoms very suggestive of hyperthyroidism The hyperthyroidism is likely to be from Graves' disease     Discussed with the patient the hyperthyroidism is a result of an autoimmune condition involving the thyroid.  Explained that the options for treatment are basically antithyroid drugs and radioactive iodine.  Discussed the pros and cons for each treatment: Antithyroid drugs would be reasonable for mild disease but would need frequent followup with lab monitoring as well as potential for side effects from the medications and uncertainty about long-term cure of the problem Discussed that I-131 treatment is safe and simple to do but will result in long-term hypothyroidism that will result from ablation of the thyroid tissue and the need for lifelong supplementation and periodic monitoring.  Clinically would be appropriate for the patient to be treated with antithyroid drugs at this time with  methimazole She will be started on a dose appropriate for her lab results and discussed that treatment regimen will be continued up to a year Discussed possible side effects of methimazole  Patient handout on hyperthyroidism with information on evaluation and treatment and also radioactive iodine treatment given Patient understands the above discussion and treatment options. All questions were answered satisfactorily  She will have labs done today including thyrotropin receptor antibody and baseline liver test and CBC  Consultation note sent to the referring physician  [name] 06/09/2019  ADDENDUM: Thyroid levels are significantly high and she will start methimazole 20 mg twice daily  Appointment on 06/09/2019  Component Date Value Ref Range Status  . T3, Free 06/09/2019 25.0* 2.3 - 4.2  pg/mL Final  . Free T4 06/09/2019 5.18* 0.60 - 1.60 ng/dL Final   Comment: Specimens from patients who are undergoing biotin therapy and /or ingesting biotin supplements may contain high levels of biotin.  The higher biotin concentration in these specimens interferes with this Free T4 assay.  Specimens that contain high levels  of biotin may cause false high results for this Free T4 assay.  Please interpret results in light of the total clinical presentation of the patient.    Marland Kitchen TSH 06/09/2019 <0.01* 0.35 - 4.50 uIU/mL Final  . WBC 06/09/2019 4.5  4.0 - 10.5 K/uL Final  . RBC 06/09/2019 3.96  3.87 - 5.11 Mil/uL Final  . Platelets 06/09/2019 281.0  150.0 - 400.0 K/uL Final  . Hemoglobin 06/09/2019 10.7* 12.0 - 15.0 g/dL Final  . HCT 39/76/7341 33.4* 36.0 - 46.0 % Final  . MCV 06/09/2019 84.3  78.0 - 100.0 fl Final  . MCHC 06/09/2019 32.1  30.0 - 36.0 g/dL Final  . RDW 93/79/0240 18.3* 11.5 - 15.5 % Final  . ALT 06/09/2019 48* 0 - 35 U/L Final

## 2019-06-10 ENCOUNTER — Other Ambulatory Visit: Payer: Self-pay | Admitting: Endocrinology

## 2019-06-10 LAB — THYROTROPIN RECEPTOR AUTOABS: Thyrotropin Receptor Ab: 39.3 IU/L — ABNORMAL HIGH (ref 0.00–1.75)

## 2019-06-10 MED ORDER — METHIMAZOLE 10 MG PO TABS: 20.0000 mg | ORAL_TABLET | Freq: Two times a day (BID) | ORAL | 1 refills | Status: DC

## 2019-06-10 NOTE — Telephone Encounter (Signed)
Prescription changed

## 2019-07-01 ENCOUNTER — Other Ambulatory Visit: Payer: Self-pay | Admitting: Endocrinology

## 2019-07-05 ENCOUNTER — Other Ambulatory Visit: Payer: Self-pay | Admitting: Endocrinology

## 2019-07-07 ENCOUNTER — Other Ambulatory Visit: Payer: Self-pay

## 2019-07-07 ENCOUNTER — Other Ambulatory Visit (INDEPENDENT_AMBULATORY_CARE_PROVIDER_SITE_OTHER): Payer: 59

## 2019-07-07 DIAGNOSIS — E0590 Thyrotoxicosis, unspecified without thyrotoxic crisis or storm: Secondary | ICD-10-CM

## 2019-07-07 LAB — CBC
HCT: 34.8 % — ABNORMAL LOW (ref 36.0–46.0)
Hemoglobin: 11.5 g/dL — ABNORMAL LOW (ref 12.0–15.0)
MCHC: 33 g/dL (ref 30.0–36.0)
MCV: 83.8 fl (ref 78.0–100.0)
Platelets: 235 10*3/uL (ref 150.0–400.0)
RBC: 4.15 Mil/uL (ref 3.87–5.11)
RDW: 15.3 % (ref 11.5–15.5)
WBC: 5.8 10*3/uL (ref 4.0–10.5)

## 2019-07-07 LAB — T4, FREE: Free T4: 1.6 ng/dL (ref 0.60–1.60)

## 2019-07-07 LAB — T3, FREE: T3, Free: 6.2 pg/mL — ABNORMAL HIGH (ref 2.3–4.2)

## 2019-07-07 LAB — ALT: ALT: 22 U/L (ref 0–35)

## 2019-07-11 ENCOUNTER — Other Ambulatory Visit: Payer: Self-pay

## 2019-07-11 ENCOUNTER — Encounter: Payer: Self-pay | Admitting: Endocrinology

## 2019-07-11 ENCOUNTER — Ambulatory Visit (INDEPENDENT_AMBULATORY_CARE_PROVIDER_SITE_OTHER): Payer: 59 | Admitting: Endocrinology

## 2019-07-11 DIAGNOSIS — E0590 Thyrotoxicosis, unspecified without thyrotoxic crisis or storm: Secondary | ICD-10-CM

## 2019-07-11 NOTE — Progress Notes (Signed)
Patient ID: Maria Hoffman, female   DOB: 06-28-1980, 39 y.o.   MRN: [id number]             I connected with the above-named patient by video enabled telemedicine application and verified that I am speaking with the correct person. The patient was explained the limitations of evaluation and management by telemedicine and the availability of in person appointments.  Patient also understood that there may be a patient responsible charge related to this service . Location of the patient: Patient's home . Location of the provider: Physician office Only the patient and myself were participating in the encounter The patient understood the above statements and agreed to proceed.  Reason for Appointment: Follow-up of thyroid  The patient was referred by her PCP Dr.Koirala   History of Present Illness:   Initial history on consultation:  For the last 3 to 4 weeks she has had symptoms of palpitations, shakiness, itching, weakness and fatigue. She feels her heart racing and it appears to be going fast all the time including when she is lying down at night She also will get a little out of breath when she is trying to be active She feels that when she is getting down or stooping she will have difficulty getting back up She also is having some rash on her back, itching She has some shakiness of her hands when she is trying to hold an object Overall she feels more tired Does not think she has excessive anxiety or nervousness She may feel warm at times but does not have any excessive heat consistently or any sweating Does not think she has lost any significant weight  She does have a known goiter since at least 2005  RECENT HISTORY:  She had marked hyperthyroidism along with significant goiter and increase in thyrotropin receptor antibodies on her initial consultation in 2/21 Although she was supposed to take 20 mg twice daily of methimazole for some reason she is taking only 10 mg twice a  day She had initially taken metoprolol ER  She says that all her symptoms including palpitations, shakiness, fatigue, weakness and dyspnea are better She may have lost a little weight recently She is having only some itching persisting on her back Recently ran out of metoprolol and also without recurrence of palpitations  She has taken her methimazole regularly However her free T3 level is still relatively high   Lab Results  Component Value Date   FREET4 1.60 07/07/2019   FREET4 5.18 (H) 06/09/2019   TSH <0.01 (L) 06/09/2019   Lab Results  Component Value Date   T3FREE 6.2 (H) 07/07/2019   T3FREE 25.0 (H) 06/09/2019   Lab Results  Component Value Date   THYROTRECAB 39.30 (H) 06/09/2019     Allergies as of 07/11/2019      Reactions   Flagyl [metronidazole] Other (See Comments)   Makes dark spots on skin   Sulfa Antibiotics    Makes dark marks on skin      Medication List       Accurate as of July 11, 2019  2:11 PM. If you have any questions, ask your nurse or doctor.        methimazole 10 MG tablet Commonly known as: TAPAZOLE TAKE 2 TABLETS (20 MG TOTAL) BY MOUTH 2 (TWO) TIMES DAILY. UPDATED DOSAGE   metoprolol succinate 50 MG 24 hr tablet Commonly known as: TOPROL-XL TAKE 1 TABLET (50 MG TOTAL) BY MOUTH DAILY. TAKE WITH OR IMMEDIATELY FOLLOWING  A MEAL.       Allergies:  Allergies  Allergen Reactions  . Flagyl [Metronidazole] Other (See Comments)    Makes dark spots on skin  . Sulfa Antibiotics     Makes dark marks on skin    Past Medical History:  Diagnosis Date  . Medical history non-contributory     HYPOTHYROIDISM: She had mild subclinical hyperthyroidism in the first year after her pregnancy in 2005 This was not requiring any treatment and resolved spontaneously  In 2009 she had a 2.3 cm thyroid nodule which on follow-up in 2012 had increased in size Needle aspiration biopsy in 2013 showed benign follicular nodule    Past Surgical  History:  Procedure Laterality Date  . BREAST ENHANCEMENT SURGERY    . NO PAST SURGERIES      Family History  Problem Relation Age of Onset  . Diabetes Mother   . Hypothyroidism Mother   . Hypothyroidism Maternal Grandmother     Social History:  reports that she has never smoked. She has never used smokeless tobacco. She reports that she does not drink alcohol or use drugs.   Review of Systems      Examination:   There were no vitals taken for this visit.    Assessment/Plan:  HYPERTHYROIDISM with Graves' disease  She has had marked hyperthyroidism with baseline large goiter, significant symptoms and high thyrotropin receptor antibody  While the patient is not seen in person today she reportedly is feeling much better Also has not had any palpitations and her weakness is improving She is off metoprolol currently  Her free T4 is still upper normal and T3 level is high although improved significantly  Currently on 10 mg twice daily of methimazole She will increase her dose by 5 mg daily and add another half tablet in the evening This should further improve her thyroid levels  Discussed that potentially if her thyroid levels are not consistently improving and we cannot taper down her metoprolol we may consider I-131 treatment Follow-up in about a month  [name] 07/11/2019    Lab on 07/07/2019  Component Date Value Ref Range Status  . WBC 07/07/2019 5.8  4.0 - 10.5 K/uL Final  . RBC 07/07/2019 4.15  3.87 - 5.11 Mil/uL Final  . Platelets 07/07/2019 235.0  150.0 - 400.0 K/uL Final  . Hemoglobin 07/07/2019 11.5* 12.0 - 15.0 g/dL Final  . HCT 07/07/2019 34.8* 36.0 - 46.0 % Final  . MCV 07/07/2019 83.8  78.0 - 100.0 fl Final  . MCHC 07/07/2019 33.0  30.0 - 36.0 g/dL Final  . RDW 07/07/2019 15.3  11.5 - 15.5 % Final  . ALT 07/07/2019 22  0 - 35 U/L Final  . T3, Free 07/07/2019 6.2* 2.3 - 4.2 pg/mL Final  . Free T4 07/07/2019 1.60  0.60 - 1.60 ng/dL Final    Comment: Specimens from patients who are undergoing biotin therapy and /or ingesting biotin supplements may contain high levels of biotin.  The higher biotin concentration in these specimens interferes with this Free T4 assay.  Specimens that contain high levels  of biotin may cause false high results for this Free T4 assay.  Please interpret results in light of the total clinical presentation of the patient.

## 2019-08-03 ENCOUNTER — Other Ambulatory Visit: Payer: Self-pay | Admitting: Endocrinology

## 2019-08-11 ENCOUNTER — Other Ambulatory Visit: Payer: Self-pay

## 2019-08-11 ENCOUNTER — Other Ambulatory Visit (INDEPENDENT_AMBULATORY_CARE_PROVIDER_SITE_OTHER): Payer: 59

## 2019-08-11 DIAGNOSIS — E0590 Thyrotoxicosis, unspecified without thyrotoxic crisis or storm: Secondary | ICD-10-CM | POA: Diagnosis not present

## 2019-08-11 LAB — T3, FREE: T3, Free: 4.1 pg/mL (ref 2.3–4.2)

## 2019-08-11 LAB — T4, FREE: Free T4: 0.77 ng/dL (ref 0.60–1.60)

## 2019-08-11 LAB — TSH: TSH: <0.01 u[IU]/mL — ABNORMAL LOW (ref 0.35–4.50)

## 2019-08-15 ENCOUNTER — Encounter: Payer: 59 | Admitting: Endocrinology

## 2019-08-15 ENCOUNTER — Ambulatory Visit: Payer: 59 | Admitting: Endocrinology

## 2019-08-15 DIAGNOSIS — Z0289 Encounter for other administrative examinations: Secondary | ICD-10-CM

## 2019-08-15 NOTE — Progress Notes (Signed)
This encounter was created in error - please disregard.

## 2019-09-28 ENCOUNTER — Other Ambulatory Visit: Payer: Self-pay | Admitting: Endocrinology

## 2019-09-28 ENCOUNTER — Other Ambulatory Visit (INDEPENDENT_AMBULATORY_CARE_PROVIDER_SITE_OTHER): Payer: 59

## 2019-09-28 ENCOUNTER — Other Ambulatory Visit: Payer: Self-pay

## 2019-09-28 DIAGNOSIS — E0590 Thyrotoxicosis, unspecified without thyrotoxic crisis or storm: Secondary | ICD-10-CM

## 2019-09-28 LAB — TSH: TSH: 1.31 u[IU]/mL (ref 0.35–4.50)

## 2019-09-28 LAB — T4, FREE: Free T4: 0.62 ng/dL (ref 0.60–1.60)

## 2019-09-29 LAB — THYROTROPIN RECEPTOR AUTOABS: Thyrotropin Receptor Ab: 15.3 IU/L — ABNORMAL HIGH (ref 0.00–1.75)

## 2019-10-03 ENCOUNTER — Ambulatory Visit: Payer: 59 | Admitting: Endocrinology

## 2019-10-03 ENCOUNTER — Other Ambulatory Visit: Payer: Self-pay

## 2019-10-04 ENCOUNTER — Encounter: Payer: Self-pay | Admitting: Endocrinology

## 2019-10-04 ENCOUNTER — Ambulatory Visit (INDEPENDENT_AMBULATORY_CARE_PROVIDER_SITE_OTHER): Payer: 59 | Admitting: Endocrinology

## 2019-10-04 VITALS — BP 134/80 | HR 72 | Ht 66.0 in | Wt 192.2 lb

## 2019-10-04 DIAGNOSIS — E0590 Thyrotoxicosis, unspecified without thyrotoxic crisis or storm: Secondary | ICD-10-CM

## 2019-10-04 MED ORDER — METHIMAZOLE 10 MG PO TABS: 15.0000 mg | ORAL_TABLET | Freq: Every day | ORAL | 0 refills | Status: DC

## 2019-10-04 NOTE — Progress Notes (Addendum)
Patient ID: Maria Hoffman, female   DOB: 29-Jan-1981, 39 y.o.   MRN: [id number]              Reason for Appointment: Follow-up of thyroid    History of Present Illness:   Initial history on consultation:  For the last 3 to 4 weeks she has had symptoms of palpitations, shakiness, itching, weakness and fatigue. She feels her heart racing and it appears to be going fast all the time including when she is lying down at night She also will get a little out of breath when she is trying to be active She feels that when she is getting down or stooping she will have difficulty getting back up She also is having some rash on her back, itching She has some shakiness of her hands when she is trying to hold an object Overall she feels more tired Does not think she has excessive anxiety or nervousness She may feel warm at times but does not have any excessive heat consistently or any sweating Does not think she has lost any significant weight  She does have a known goiter since at least 2005  RECENT HISTORY:  She had marked hyperthyroidism along with significant goiter and increase in thyrotropin receptor antibodies on her initial consultation in 2/21 She had initially taken metoprolol ER for heart rate control  Did not have any further palpitations, shakiness, fatigue, weakness and dyspnea At times may get tired after work but otherwise feels fairly good She does not think she has any significant rash or itching on her back May at times feel cold but nothing unusual  On her last visit because of her free T4 being upper normal she was told to add an additional half tablet of her 10 mg methimazole but she forgot and she is still taking only 20 mg daily, she takes this in the morning instead of twice daily as prescribed  She has taken her methimazole regularly recently  TSH is back to normal now and free T4 is low normal at 0.62  Wt Readings from Last 3 Encounters:  10/04/19 192 lb  3.2 oz (87.2 kg)  06/09/19 202 lb 3.2 oz (91.7 kg)  01/18/14 165 lb (74.8 kg)    Lab Results  Component Value Date   FREET4 0.62 09/28/2019   FREET4 0.77 08/11/2019   FREET4 1.60 07/07/2019   TSH 1.31 09/28/2019   TSH <0.01 (L) 08/11/2019   TSH <0.01 (L) 06/09/2019   Lab Results  Component Value Date   T3FREE 4.1 08/11/2019   T3FREE 6.2 (H) 07/07/2019   T3FREE 25.0 (H) 06/09/2019   Lab Results  Component Value Date   THYROTRECAB 15.30 (H) 09/28/2019   THYROTRECAB 39.30 (H) 06/09/2019     Allergies as of 10/04/2019      Reactions   Flagyl [metronidazole] Other (See Comments)   Makes dark spots on skin   Sulfa Antibiotics    Makes dark marks on skin      Medication List       Accurate as of October 04, 2019  1:31 PM. If you have any questions, ask your nurse or doctor.        methimazole 10 MG tablet Commonly known as: TAPAZOLE TAKE 2 TABLETS (20 MG TOTAL) BY MOUTH 2 (TWO) TIMES DAILY. UPDATED DOSAGE What changed:   how much to take  when to take this  additional instructions   metoprolol succinate 50 MG 24 hr tablet Commonly known as: TOPROL-XL Take  1.5 tablets (15mg  total) by mouth in the morning and 1/2 tablet (5mg  total) in the evening. What changed:   how much to take  how to take this  additional instructions       Allergies:  Allergies  Allergen Reactions  . Flagyl [Metronidazole] Other (See Comments)    Makes dark spots on skin  . Sulfa Antibiotics     Makes dark marks on skin    Past Medical History:  Diagnosis Date  . Medical history non-contributory     HYPOTHYROIDISM: She had mild subclinical hyperthyroidism in the first year after her pregnancy in 2005 This was not requiring any treatment and resolved spontaneously  In 2009 she had a 2.3 cm thyroid nodule which on follow-up in 2012 had increased in size Needle aspiration biopsy in 2013 showed benign follicular nodule    Past Surgical History:  Procedure Laterality Date    . BREAST ENHANCEMENT SURGERY    . NO PAST SURGERIES      Family History  Problem Relation Age of Onset  . Diabetes Mother   . Hypothyroidism Mother   . Hypothyroidism Maternal Grandmother     Social History:  reports that she has never smoked. She has never used smokeless tobacco. She reports that she does not drink alcohol and does not use drugs.   Review of Systems  She may occasionally have swelling of her legs   Examination:   BP 134/80 (BP Location: Left Arm, Patient Position: Sitting, Cuff Size: Normal)   Pulse 72   Ht 5\' 6"  (1.676 m)   Wt 192 lb 3.2 oz (87.2 kg)   BMI 31.02 kg/m   No eye signs  Thyroid is palpable mostly in the right medial region and about 2-2 half times normal Left lobe is 1-1/2-2 times normal and soft No tremor Biceps reflexes are brisk  Assessment/Plan:  HYPERTHYROIDISM with Graves' disease  She had marked hyperthyroidism with baseline large goiter, significant symptoms and high thyrotropin receptor antibody  She is subjectively doing well and is not having any unusual fatigue or weakness or other hyperthyroid symptoms  Currently on 20 mg once daily of methimazole Despite not increasing her medication as directed her thyroid levels are now low normal with free T4 0.60  Thyrotropin receptor antibody improved at 15 although still high Discussed need for continued antithyroid drugs for several months unless she appears to be not responding to it as well  She will now take 1-1/2 tablets of the 10 mg methimazole, new prescription sent  Follow-up in 6 weeks, does need to make more regular follow-up visits  2013 10/04/2019    Lab on 09/28/2019  Component Date Value Ref Range Status  . Free T4 09/28/2019 0.62  0.60 - 1.60 ng/dL Final   Comment: Specimens from patients who are undergoing biotin therapy and /or ingesting biotin supplements may contain high levels of biotin.  The higher biotin concentration in these specimens  interferes with this Free T4 assay.  Specimens that contain high levels  of biotin may cause false high results for this Free T4 assay.  Please interpret results in light of the total clinical presentation of the patient.    10/06/2019 TSH 09/28/2019 1.31  0.35 - 4.50 uIU/mL Final  . Thyrotropin Receptor Ab 09/28/2019 15.30* 0.00 - 1.75 IU/L Final

## 2019-11-10 ENCOUNTER — Other Ambulatory Visit: Payer: 59

## 2019-11-15 ENCOUNTER — Ambulatory Visit: Payer: 59 | Admitting: Endocrinology

## 2019-11-15 DIAGNOSIS — Z0289 Encounter for other administrative examinations: Secondary | ICD-10-CM

## 2019-12-12 ENCOUNTER — Other Ambulatory Visit: Payer: 59

## 2019-12-14 ENCOUNTER — Other Ambulatory Visit: Payer: Self-pay

## 2019-12-19 ENCOUNTER — Ambulatory Visit: Payer: 59 | Admitting: Endocrinology

## 2020-01-19 ENCOUNTER — Other Ambulatory Visit: Payer: Self-pay | Admitting: Endocrinology

## 2020-03-04 ENCOUNTER — Ambulatory Visit: Payer: Medicaid Other | Admitting: Endocrinology

## 2020-03-04 ENCOUNTER — Other Ambulatory Visit: Payer: Self-pay

## 2020-03-04 ENCOUNTER — Encounter: Payer: Self-pay | Admitting: Endocrinology

## 2020-03-04 VITALS — BP 132/88 | HR 74 | Ht 66.0 in | Wt 187.6 lb

## 2020-03-04 DIAGNOSIS — E041 Nontoxic single thyroid nodule: Secondary | ICD-10-CM | POA: Diagnosis not present

## 2020-03-04 DIAGNOSIS — E0590 Thyrotoxicosis, unspecified without thyrotoxic crisis or storm: Secondary | ICD-10-CM | POA: Diagnosis not present

## 2020-03-04 LAB — T4, FREE: Free T4: 0.5 ng/dL — ABNORMAL LOW (ref 0.60–1.60)

## 2020-03-04 LAB — TSH: TSH: 14.85 u[IU]/mL — ABNORMAL HIGH (ref 0.35–4.50)

## 2020-03-04 NOTE — Progress Notes (Addendum)
Patient ID: Maria Hoffman, female   DOB: 08/23/1980, 39 y.o.   MRN: [id number]              Reason for Appointment: Follow-up of thyroid    History of Present Illness:   Initial history on consultation:  For the last 3 to 4 weeks she has had symptoms of palpitations, shakiness, itching, weakness and fatigue. She feels her heart racing and it appears to be going fast all the time including when she is lying down at night She also will get a little out of breath when she is trying to be active She feels that when she is getting down or stooping she will have difficulty getting back up She also is having some rash on her back, itching She has some shakiness of her hands when she is trying to hold an object Overall she feels more tired Does not think she has excessive anxiety or nervousness She may feel warm at times but does not have any excessive heat consistently or any sweating Does not think she has lost any significant weight  She does have a known goiter since at least 2005  RECENT HISTORY:  She had marked hyperthyroidism along with significant goiter and increase in thyrotropin receptor antibodies on her initial consultation in 2/21 She had initially taken metoprolol ER for heart rate control  On her last visit because of her free T4 being low normal she was advised to take only 1-1/2 tablets of methimazole 10 mg instead of 20 mg  Recently she has not had any symptoms of palpitations, heat or cold intolerance, shakiness or fatigue Her weight is down 5 pounds  She did not come back for follow-up as directed She is coming back now because over the last month or so she is feeling a pressure in her neck when she is holding her arms up for turning her neck a certain way  On her own she increased her methimazole to 2 tablets daily after she started having the symptoms  Labs not available yet  Wt Readings from Last 3 Encounters:  03/04/20 187 lb 9.6 oz (85.1 kg)    10/04/19 192 lb 3.2 oz (87.2 kg)  06/09/19 202 lb 3.2 oz (91.7 kg)    Lab Results  Component Value Date   FREET4 0.62 09/28/2019   FREET4 0.77 08/11/2019   FREET4 1.60 07/07/2019   TSH 1.31 09/28/2019   TSH <0.01 (L) 08/11/2019   TSH <0.01 (L) 06/09/2019   Lab Results  Component Value Date   T3FREE 4.1 08/11/2019   T3FREE 6.2 (H) 07/07/2019   T3FREE 25.0 (H) 06/09/2019   Lab Results  Component Value Date   THYROTRECAB 15.30 (H) 09/28/2019   THYROTRECAB 39.30 (H) 06/09/2019   THYROID NODULE:  In 2009 she had a 2.3 cm thyroid nodule which on follow-up in 2012 had increased in size Needle aspiration biopsy in 2013 showed benign follicular nodule   Allergies as of 03/04/2020      Reactions   Flagyl [metronidazole] Other (See Comments)   Makes dark spots on skin   Sulfa Antibiotics    Makes dark marks on skin      Medication List       Accurate as of March 04, 2020  1:45 PM. If you have any questions, ask your nurse or doctor.        methimazole 10 MG tablet Commonly known as: TAPAZOLE Take 1.5 tablets (15 mg total) by mouth daily. Updated dosage  Allergies:  Allergies  Allergen Reactions  . Flagyl [Metronidazole] Other (See Comments)    Makes dark spots on skin  . Sulfa Antibiotics     Makes dark marks on skin    Past Medical History:  Diagnosis Date  . Medical history non-contributory        Past Surgical History:  Procedure Laterality Date  . BREAST ENHANCEMENT SURGERY    . NO PAST SURGERIES      Family History  Problem Relation Age of Onset  . Diabetes Mother   . Hypothyroidism Mother   . Hypothyroidism Maternal Grandmother     Social History:  reports that she has never smoked. She has never used smokeless tobacco. She reports that she does not drink alcohol and does not use drugs.   Review of Systems      Examination:   BP 132/88   Pulse 74   Ht 5\' 6"  (1.676 m)   Wt 187 lb 9.6 oz (85.1 kg)   SpO2 95%   BMI  30.28 kg/m   No eye signs  Thyroid is significantly enlarged in the right lobe and isthmus Thyroid is about 3 times normal on the right side, smooth and slightly firm, no distinct nodules felt  Left lobe is 1-1/2-2 times normal and soft No stridor present Pemberton sign negative but patient experiences some choking sensation on raising her arms  No tremor Biceps reflexes are slightly brisk  Assessment/Plan:  HYPERTHYROIDISM with Graves' disease  She had marked hyperthyroidism with baseline large goiter, significant symptoms and high thyrotropin receptor antibody Initial symptoms were in 04/2019  She has been as before very irregular with her follow-up In 6/16 she had low normal T4 levels and was told to reduce methimazole to 15 mg  Currently on 20 mg once daily of methimazole and she had increased her dose on her own without any consultation  Currently appears to be having an increase in her thyroid size on the right side with some pressure symptoms when she turns her neck or raises her arms Not clear if this is related to possible hypothyroidism or growth in her thyroid nodule that she has had since 2009  We will check thyroid levels today and decide on further management   2010 03/04/2020  Addendum: TSH is 14.9, she will stop methimazole for 1 week and then only take 5 mg daily, my chart message sent

## 2020-03-07 ENCOUNTER — Encounter: Payer: Self-pay | Admitting: *Deleted

## 2020-06-15 ENCOUNTER — Other Ambulatory Visit: Payer: Self-pay | Admitting: Endocrinology

## 2021-07-16 ENCOUNTER — Other Ambulatory Visit: Payer: Self-pay | Admitting: Endocrinology

## 2021-07-16 DIAGNOSIS — E0590 Thyrotoxicosis, unspecified without thyrotoxic crisis or storm: Secondary | ICD-10-CM

## 2021-07-17 ENCOUNTER — Other Ambulatory Visit (INDEPENDENT_AMBULATORY_CARE_PROVIDER_SITE_OTHER): Payer: Medicaid Other

## 2021-07-17 DIAGNOSIS — E0590 Thyrotoxicosis, unspecified without thyrotoxic crisis or storm: Secondary | ICD-10-CM | POA: Diagnosis not present

## 2021-07-17 LAB — TSH: TSH: 0.77 u[IU]/mL (ref 0.35–5.50)

## 2021-07-17 LAB — T4, FREE: Free T4: 1.11 ng/dL (ref 0.60–1.60)

## 2021-07-18 LAB — THYROTROPIN RECEPTOR AUTOABS: Thyrotropin Receptor Ab: <1.1 IU/L (ref 0.00–1.75)

## 2021-07-24 ENCOUNTER — Encounter: Payer: Self-pay | Admitting: Endocrinology

## 2021-07-24 ENCOUNTER — Ambulatory Visit (INDEPENDENT_AMBULATORY_CARE_PROVIDER_SITE_OTHER): Payer: Medicaid Other | Admitting: Endocrinology

## 2021-07-24 VITALS — BP 120/76 | HR 78 | Ht 66.0 in | Wt 210.6 lb

## 2021-07-24 DIAGNOSIS — E049 Nontoxic goiter, unspecified: Secondary | ICD-10-CM

## 2021-07-24 DIAGNOSIS — Z8639 Personal history of other endocrine, nutritional and metabolic disease: Secondary | ICD-10-CM

## 2021-07-24 NOTE — Patient Instructions (Signed)
Thyroid is normal

## 2021-07-24 NOTE — Progress Notes (Signed)
Patient ID: Maria Hoffman, female   DOB: 15-Aug-1980, 41 y.o.   MRN: [id number] ? ?       ? ? ? ? ? ?Reason for Appointment: Follow-up of thyroid ? ? ? History of Present Illness:  ? ?Initial history on consultation: ? ?For the last 3 to 4 weeks she has had symptoms of palpitations, shakiness, itching, weakness and fatigue. ?She feels her heart racing and it appears to be going fast all the time including when she is lying down at night ?She also will get a little out of breath when she is trying to be active ?She feels that when she is getting down or stooping she will have difficulty getting back up ?She also is having some rash on her back, itching ?She has some shakiness of her hands when she is trying to hold an object ?Overall she feels more tired ?Does not think she has excessive anxiety or nervousness ?She may feel warm at times but does not have any excessive heat consistently or any sweating ?Does not think she has lost any significant weight ? ?She does have a known goiter since at least 2005 ? ?RECENT HISTORY: ? ?She had marked hyperthyroidism along with significant goiter and increase in thyrotropin receptor antibodies on her initial consultation in 05/2019 ?She had initially taken metoprolol ER for heart rate control ? ?She had been on methimazole in 2021 and was last seen in 11/21 when she was recommended taking only 5 mg daily ? ?She did not come back for follow-up as directed subsequently ? ?She is coming back for follow-up now since again she feels a choking sensation or pressure when she is holding her arms but not consistently.  She had the same or similar symptoms when she was seen in 11/21 ?No difficulty swallowing ?On her last visit her right thyroid lobe was about 3 times normal size ?She ran out of her methimazole about a year or so ago and did not come back for follow-up ? ?Recently she has not had any symptoms of palpitations, fatigue or heat intolerance, does have some recent hair  loss ?Her weight is up significantly around 23 pounds ? ?Thyroid levels and thyrotropin receptor antibody are now quite normal ? ? ?Wt Readings from Last 3 Encounters:  ?07/24/21 210 lb 9.6 oz (95.5 kg)  ?03/04/20 187 lb 9.6 oz (85.1 kg)  ?10/04/19 192 lb 3.2 oz (87.2 kg)  ? ? ?Lab Results  ?Component Value Date  ? FREET4 1.11 07/17/2021  ? FREET4 0.50 (L) 03/04/2020  ? FREET4 0.62 09/28/2019  ? TSH 0.77 07/17/2021  ? TSH 14.85 (H) 03/04/2020  ? TSH 1.31 09/28/2019  ? ?Lab Results  ?Component Value Date  ? T3FREE 4.1 08/11/2019  ? T3FREE 6.2 (H) 07/07/2019  ? T3FREE 25.0 (H) 06/09/2019  ? ?Lab Results  ?Component Value Date  ? THYROTRECAB <1.10 07/17/2021  ? THYROTRECAB 15.30 (H) 09/28/2019  ? THYROTRECAB 39.30 (H) 06/09/2019  ? ?THYROID NODULE: ? ?In 2009 she had a 2.3 cm thyroid nodule which on follow-up in 2012 had increased in size ?Needle aspiration biopsy in 2013 showed benign follicular nodule ? ? ?Allergies as of 07/24/2021   ? ?   Reactions  ? Flagyl [metronidazole] Other (See Comments)  ? Makes dark spots on skin  ? Sulfa Antibiotics   ? Makes dark marks on skin  ? ?  ? ?  ?Medication List  ?  ? ?  ? Accurate as of July 24, 2021 11:18  AM. If you have any questions, ask your nurse or doctor.  ?  ?  ? ?  ? ?methimazole 10 MG tablet ?Commonly known as: TAPAZOLE ?TAKE 1 TABLET BY MOUTH TWICE A DAY ?  ? ?  ? ? ?Allergies:  ?Allergies  ?Allergen Reactions  ? Flagyl [Metronidazole] Other (See Comments)  ?  Makes dark spots on skin  ? Sulfa Antibiotics   ?  Makes dark marks on skin  ? ? ?Past Medical History:  ?Diagnosis Date  ? Medical history non-contributory   ? ? ? ? ? ?Past Surgical History:  ?Procedure Laterality Date  ? BREAST ENHANCEMENT SURGERY    ? NO PAST SURGERIES    ? ? ?Family History  ?Problem Relation Age of Onset  ? Diabetes Mother   ? Hypothyroidism Mother   ? Hypothyroidism Maternal Grandmother   ? ? ?Social History:  reports that she has never smoked. She has never used smokeless tobacco. She  reports that she does not drink alcohol and does not use drugs. ? ? ?Review of Systems ? ?  ? ? Examination: ?  ?BP 120/76   Pulse 78   Ht 5\' 6"  (1.676 m)   Wt 210 lb 9.6 oz (95.5 kg)   SpO2 99%   BMI 33.99 kg/m?  ? ?Eyes are not unusually prominent ?No obvious alopecia ? ?Right thyroid lobe is fleshy and about twice normal in size, smooth without any nodules ? ?Left lobe is 1-1/2-2 times normal and soft ?No stridor present ?Pemberton sign negative with no choking sensation elicited ? ?No tremor ?Biceps reflexes are slightly brisk ?Skin not unusually warm or moist ? ?Assessment/Plan: ? ?History of of Graves' disease ? ?She had marked hyperthyroidism with baseline large goiter, significant symptoms and high thyrotropin receptor antibody ?Initial symptoms were in 04/2019 ? ?She has been as before very irregular with her follow-up ?She was told to reduce her methimazole in 11/21 and follow-up but she has not been on any treatment for at least a year ? ?However currently she is euthyroid with normal thyroid levels ?Has again a persistent goiter but this is smaller than on her last visit when she was hypothyroid ? ?Her weight gain and hair loss appear to be unrelated to thyroid disease as her thyroid levels are quite normal ?She has sporadic symptoms of a choking sensation when she raises her arms but this is not new ?Thyroid enlargement is not significant enough to cause symptoms ?No nodule felt on exam ? ?Reassured her that her Graves' disease is now in remission and she can be seen once a year for follow-up ?She will call if she has any increasing local pressure symptoms ? ?Maria Hoffman ?07/24/2021 ? ? ? ? ?

## 2022-07-27 ENCOUNTER — Other Ambulatory Visit: Payer: Self-pay | Admitting: Endocrinology

## 2022-07-27 ENCOUNTER — Other Ambulatory Visit: Payer: 59

## 2022-07-27 DIAGNOSIS — E049 Nontoxic goiter, unspecified: Secondary | ICD-10-CM

## 2022-07-30 ENCOUNTER — Ambulatory Visit: Payer: Medicaid Other | Admitting: Endocrinology
